# Patient Record
Sex: Female | Born: 1960 | Race: White | Hispanic: No | Marital: Single | Smoking: Current every day smoker
Health system: Southern US, Community
[De-identification: ages and names within clinical notes are randomized; demographics above are authoritative.]

## PROBLEM LIST (undated history)

## (undated) HISTORY — PX: ABDOMINAL HYSTERECTOMY: SHX81

## (undated) HISTORY — PX: APPENDECTOMY: SHX54

---

## 2019-09-20 ENCOUNTER — Other Ambulatory Visit: Payer: Self-pay

## 2019-09-20 ENCOUNTER — Ambulatory Visit
Admission: EM | Admit: 2019-09-20 | Discharge: 2019-09-20 | Disposition: A | Discharge status: Home / Self Care | Payer: No Typology Code available for payment source | Attending: Family Medicine | Admitting: Family Medicine

## 2019-09-20 ENCOUNTER — Ambulatory Visit (INDEPENDENT_AMBULATORY_CARE_PROVIDER_SITE_OTHER): Payer: No Typology Code available for payment source

## 2019-09-20 ENCOUNTER — Encounter: Payer: Self-pay | Admitting: Emergency Medicine

## 2019-09-20 DIAGNOSIS — S52502A Unspecified fracture of the lower end of left radius, initial encounter for closed fracture: Secondary | ICD-10-CM

## 2019-09-20 DIAGNOSIS — S52602A Unspecified fracture of lower end of left ulna, initial encounter for closed fracture: Secondary | ICD-10-CM

## 2019-09-20 MED ORDER — HYDROCODONE-ACETAMINOPHEN 5-325 MG PO TABS: ORAL_TABLET | ORAL | 0 refills | Status: AC

## 2019-09-20 NOTE — Discharge Instructions (Addendum)
Call [facility] on Monday for follow up early next week
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
## 2019-09-22 ENCOUNTER — Other Ambulatory Visit: Payer: Self-pay | Admitting: Surgery

## 2019-09-22 ENCOUNTER — Other Ambulatory Visit: Payer: Self-pay

## 2019-09-22 ENCOUNTER — Other Ambulatory Visit
Admission: RE | Admit: 2019-09-22 | Discharge: 2019-09-22 | Disposition: A | Discharge status: Home / Self Care | Payer: BC Managed Care – PPO | Source: Ambulatory Visit | Attending: Surgery | Admitting: Surgery

## 2019-09-22 DIAGNOSIS — Z20822 Contact with and (suspected) exposure to covid-19: Secondary | ICD-10-CM | POA: Insufficient documentation

## 2019-09-22 DIAGNOSIS — Z01812 Encounter for preprocedural laboratory examination: Secondary | ICD-10-CM | POA: Insufficient documentation

## 2019-09-22 LAB — SARS CORONAVIRUS 2 (TAT 6-24 HRS): SARS Coronavirus 2: NEGATIVE

## 2019-09-25 ENCOUNTER — Encounter: Payer: Self-pay | Admitting: Surgery

## 2019-09-25 ENCOUNTER — Ambulatory Visit: Payer: BC Managed Care – PPO | Admitting: Registered Nurse

## 2019-09-25 ENCOUNTER — Encounter
Admission: RE | Disposition: A | Discharge status: Home / Self Care | Payer: Self-pay | Source: Home / Self Care | Attending: Surgery

## 2019-09-25 ENCOUNTER — Other Ambulatory Visit: Payer: Self-pay

## 2019-09-25 ENCOUNTER — Ambulatory Visit
Admission: RE | Admit: 2019-09-25 | Discharge: 2019-09-25 | Disposition: A | Discharge status: Home / Self Care | Payer: BC Managed Care – PPO | Attending: Surgery | Admitting: Surgery

## 2019-09-25 ENCOUNTER — Ambulatory Visit: Payer: BC Managed Care – PPO

## 2019-09-25 DIAGNOSIS — Z7982 Long term (current) use of aspirin: Secondary | ICD-10-CM | POA: Insufficient documentation

## 2019-09-25 DIAGNOSIS — G5602 Carpal tunnel syndrome, left upper limb: Secondary | ICD-10-CM | POA: Insufficient documentation

## 2019-09-25 DIAGNOSIS — S52612A Displaced fracture of left ulna styloid process, initial encounter for closed fracture: Secondary | ICD-10-CM | POA: Insufficient documentation

## 2019-09-25 DIAGNOSIS — S52552A Other extraarticular fracture of lower end of left radius, initial encounter for closed fracture: Secondary | ICD-10-CM | POA: Insufficient documentation

## 2019-09-25 DIAGNOSIS — W010XXA Fall on same level from slipping, tripping and stumbling without subsequent striking against object, initial encounter: Secondary | ICD-10-CM | POA: Diagnosis not present

## 2019-09-25 DIAGNOSIS — F17200 Nicotine dependence, unspecified, uncomplicated: Secondary | ICD-10-CM | POA: Insufficient documentation

## 2019-09-25 HISTORY — PX: ORIF WRIST FRACTURE: SHX2133

## 2019-09-25 HISTORY — PX: CARPAL TUNNEL RELEASE: SHX101

## 2019-09-25 SURGERY — OPEN REDUCTION INTERNAL FIXATION (ORIF) WRIST FRACTURE
Anesthesia: General | Site: Wrist | Laterality: Left

## 2019-09-25 MED ORDER — METOCLOPRAMIDE HCL 5 MG/ML IJ SOLN: 5.0000 mg | Freq: Three times a day (TID) | INTRAMUSCULAR | Status: DC | PRN

## 2019-09-25 MED ORDER — PROPOFOL 10 MG/ML IV BOLUS
INTRAVENOUS | Status: AC
  Filled 2019-09-25: qty 20

## 2019-09-25 MED ORDER — DEXAMETHASONE SODIUM PHOSPHATE 10 MG/ML IJ SOLN
INTRAMUSCULAR | Status: DC | PRN
  Administered 2019-09-25: 6 mg via INTRAVENOUS

## 2019-09-25 MED ORDER — DEXMEDETOMIDINE HCL 200 MCG/2ML IV SOLN
INTRAVENOUS | Status: DC | PRN
  Administered 2019-09-25: 12 ug via INTRAVENOUS
  Administered 2019-09-25: 8 ug via INTRAVENOUS

## 2019-09-25 MED ORDER — ONDANSETRON HCL 4 MG PO TABS: 4.0000 mg | ORAL_TABLET | Freq: Four times a day (QID) | ORAL | Status: DC | PRN

## 2019-09-25 MED ORDER — ACETAMINOPHEN 10 MG/ML IV SOLN
INTRAVENOUS | Status: DC | PRN
  Administered 2019-09-25: 1000 mg via INTRAVENOUS

## 2019-09-25 MED ORDER — FENTANYL CITRATE (PF) 100 MCG/2ML IJ SOLN
INTRAMUSCULAR | Status: AC
  Filled 2019-09-25: qty 2

## 2019-09-25 MED ORDER — MIDAZOLAM HCL 2 MG/2ML IJ SOLN
INTRAMUSCULAR | Status: DC | PRN
  Administered 2019-09-25: 2 mg via INTRAVENOUS

## 2019-09-25 MED ORDER — CHLORHEXIDINE GLUCONATE 0.12 % MT SOLN
OROMUCOSAL | Status: AC
  Administered 2019-09-25: 15 mL via OROMUCOSAL
  Filled 2019-09-25: qty 15

## 2019-09-25 MED ORDER — DROPERIDOL 2.5 MG/ML IJ SOLN
0.6250 mg | Freq: Once | INTRAMUSCULAR | Status: DC | PRN
  Filled 2019-09-25: qty 2

## 2019-09-25 MED ORDER — FENTANYL CITRATE (PF) 100 MCG/2ML IJ SOLN
INTRAMUSCULAR | Status: AC
  Administered 2019-09-25: 25 ug via INTRAVENOUS
  Filled 2019-09-25: qty 2

## 2019-09-25 MED ORDER — METOCLOPRAMIDE HCL 10 MG PO TABS: 5.0000 mg | ORAL_TABLET | Freq: Three times a day (TID) | ORAL | Status: DC | PRN

## 2019-09-25 MED ORDER — PROMETHAZINE HCL 25 MG/ML IJ SOLN: 6.2500 mg | INTRAMUSCULAR | Status: DC | PRN

## 2019-09-25 MED ORDER — CEFAZOLIN SODIUM-DEXTROSE 2-4 GM/100ML-% IV SOLN
INTRAVENOUS | Status: AC
  Filled 2019-09-25: qty 100

## 2019-09-25 MED ORDER — BUPIVACAINE HCL (PF) 0.5 % IJ SOLN
INTRAMUSCULAR | Status: AC
  Filled 2019-09-25: qty 30

## 2019-09-25 MED ORDER — GLYCOPYRROLATE 0.2 MG/ML IJ SOLN
INTRAMUSCULAR | Status: DC | PRN
  Administered 2019-09-25: .2 mg via INTRAVENOUS

## 2019-09-25 MED ORDER — KETOROLAC TROMETHAMINE 30 MG/ML IJ SOLN: 30.0000 mg | Freq: Once | INTRAMUSCULAR | Status: DC | PRN

## 2019-09-25 MED ORDER — KETOROLAC TROMETHAMINE 30 MG/ML IJ SOLN
INTRAMUSCULAR | Status: AC
  Filled 2019-09-25: qty 1

## 2019-09-25 MED ORDER — METOPROLOL TARTRATE 5 MG/5ML IV SOLN
INTRAVENOUS | Status: DC | PRN
Start: 2019-09-25 — End: 2019-09-25
  Administered 2019-09-25 (×3): 1 mg via INTRAVENOUS
  Administered 2019-09-25: 2 mg via INTRAVENOUS

## 2019-09-25 MED ORDER — ONDANSETRON HCL 4 MG/2ML IJ SOLN: 4.0000 mg | Freq: Four times a day (QID) | INTRAMUSCULAR | Status: DC | PRN

## 2019-09-25 MED ORDER — KETOROLAC TROMETHAMINE 30 MG/ML IJ SOLN
INTRAMUSCULAR | Status: DC | PRN
  Administered 2019-09-25: 15 mg via INTRAVENOUS

## 2019-09-25 MED ORDER — PROPOFOL 10 MG/ML IV BOLUS
INTRAVENOUS | Status: DC | PRN
  Administered 2019-09-25: 120 mg via INTRAVENOUS
  Administered 2019-09-25: 20 mg via INTRAVENOUS

## 2019-09-25 MED ORDER — HYDROCODONE-ACETAMINOPHEN 7.5-325 MG PO TABS
1.0000 | ORAL_TABLET | Freq: Once | ORAL | Status: DC | PRN
  Filled 2019-09-25: qty 1

## 2019-09-25 MED ORDER — EPHEDRINE 5 MG/ML INJ
INTRAVENOUS | Status: AC
  Filled 2019-09-25: qty 10

## 2019-09-25 MED ORDER — LACTATED RINGERS IV SOLN: INTRAVENOUS | Status: DC

## 2019-09-25 MED ORDER — HYDROCODONE-ACETAMINOPHEN 5-325 MG PO TABS: 1.0000 | ORAL_TABLET | ORAL | Status: DC | PRN

## 2019-09-25 MED ORDER — SODIUM CHLORIDE (PF) 0.9 % IJ SOLN
INTRAMUSCULAR | Status: AC
  Filled 2019-09-25: qty 10

## 2019-09-25 MED ORDER — CHLORHEXIDINE GLUCONATE 0.12 % MT SOLN: 15.0000 mL | Freq: Once | OROMUCOSAL | Status: AC

## 2019-09-25 MED ORDER — DEXAMETHASONE SODIUM PHOSPHATE 10 MG/ML IJ SOLN
INTRAMUSCULAR | Status: AC
  Filled 2019-09-25: qty 1

## 2019-09-25 MED ORDER — ACETAMINOPHEN 160 MG/5ML PO SOLN
325.0000 mg | ORAL | Status: DC | PRN
  Filled 2019-09-25: qty 20.3

## 2019-09-25 MED ORDER — MIDAZOLAM HCL 2 MG/2ML IJ SOLN
INTRAMUSCULAR | Status: AC
  Filled 2019-09-25: qty 2

## 2019-09-25 MED ORDER — FENTANYL CITRATE (PF) 100 MCG/2ML IJ SOLN
25.0000 ug | INTRAMUSCULAR | Status: DC | PRN
  Administered 2019-09-25 (×2): 50 ug via INTRAVENOUS

## 2019-09-25 MED ORDER — LIDOCAINE HCL (PF) 2 % IJ SOLN
INTRAMUSCULAR | Status: AC
  Filled 2019-09-25: qty 5

## 2019-09-25 MED ORDER — GLYCOPYRROLATE 0.2 MG/ML IJ SOLN
INTRAMUSCULAR | Status: AC
  Filled 2019-09-25: qty 1

## 2019-09-25 MED ORDER — ONDANSETRON HCL 4 MG/2ML IJ SOLN
INTRAMUSCULAR | Status: DC | PRN
  Administered 2019-09-25: 4 mg via INTRAVENOUS

## 2019-09-25 MED ORDER — ACETAMINOPHEN 325 MG PO TABS: 325.0000 mg | ORAL_TABLET | ORAL | Status: DC | PRN

## 2019-09-25 MED ORDER — FENTANYL CITRATE (PF) 100 MCG/2ML IJ SOLN
INTRAMUSCULAR | Status: DC | PRN
  Administered 2019-09-25 (×4): 25 ug via INTRAVENOUS

## 2019-09-25 MED ORDER — METOPROLOL TARTRATE 5 MG/5ML IV SOLN
INTRAVENOUS | Status: AC
  Filled 2019-09-25: qty 5

## 2019-09-25 MED ORDER — ACETAMINOPHEN 10 MG/ML IV SOLN
INTRAVENOUS | Status: AC
  Filled 2019-09-25: qty 100

## 2019-09-25 MED ORDER — KETAMINE HCL 10 MG/ML IJ SOLN
INTRAMUSCULAR | Status: DC | PRN
Start: 2019-09-25 — End: 2019-09-25
  Administered 2019-09-25: 30 mg via INTRAVENOUS

## 2019-09-25 MED ORDER — BUPIVACAINE HCL (PF) 0.5 % IJ SOLN
INTRAMUSCULAR | Status: DC | PRN
  Administered 2019-09-25: 10 mL

## 2019-09-25 MED ORDER — ORAL CARE MOUTH RINSE: 15.0000 mL | Freq: Once | OROMUCOSAL | Status: AC

## 2019-09-25 MED ORDER — ONDANSETRON HCL 4 MG/2ML IJ SOLN
INTRAMUSCULAR | Status: AC
  Filled 2019-09-25: qty 2

## 2019-09-25 MED ORDER — LIDOCAINE HCL (CARDIAC) PF 100 MG/5ML IV SOSY
PREFILLED_SYRINGE | INTRAVENOUS | Status: DC | PRN
  Administered 2019-09-25: 60 mg via INTRAVENOUS
  Administered 2019-09-25: 40 mg via INTRAVENOUS

## 2019-09-25 MED ORDER — CEFAZOLIN SODIUM-DEXTROSE 2-4 GM/100ML-% IV SOLN
2.0000 g | INTRAVENOUS | Status: AC
  Administered 2019-09-25: 2 g via INTRAVENOUS

## 2019-09-25 MED ORDER — POTASSIUM CHLORIDE IN NACL 20-0.9 MEQ/L-% IV SOLN
INTRAVENOUS | Status: DC
  Filled 2019-09-25: qty 1000

## 2019-09-25 MED ORDER — EPHEDRINE SULFATE 50 MG/ML IJ SOLN
INTRAMUSCULAR | Status: DC | PRN
  Administered 2019-09-25: 10 mg via INTRAVENOUS

## 2019-09-25 SURGICAL SUPPLY — 57 items
APL PRP STRL LF DISP 70% ISPRP (MISCELLANEOUS) ×4
BIT DRILL 2.2 SS TIBIAL (BIT) ×4 IMPLANT
BNDG COHESIVE 4X5 TAN STRL (GAUZE/BANDAGES/DRESSINGS) ×4 IMPLANT
BNDG ELASTIC 4X5.8 VLCR STR LF (GAUZE/BANDAGES/DRESSINGS) ×4 IMPLANT
BNDG ESMARK 4X12 TAN STRL LF (GAUZE/BANDAGES/DRESSINGS) ×4 IMPLANT
CANISTER SUCT 1200ML W/VALVE (MISCELLANEOUS) ×4 IMPLANT
CHLORAPREP W/TINT 26 (MISCELLANEOUS) ×8 IMPLANT
CLOSURE WOUND 1/4X4 (GAUZE/BANDAGES/DRESSINGS) ×1
CORD BIP STRL DISP 12FT (MISCELLANEOUS) ×4 IMPLANT
COVER WAND RF STERILE (DRAPES) ×4 IMPLANT
CUFF TOURN SGL QUICK 18X4 (TOURNIQUET CUFF) ×4 IMPLANT
DRAPE FLUOR MINI C-ARM 54X84 (DRAPES) ×4 IMPLANT
DRAPE SPLIT 6X30 W/TAPE (DRAPES) ×4 IMPLANT
DRAPE SURG 17X11 SM STRL (DRAPES) ×4 IMPLANT
DRAPE U-SHAPE 47X51 STRL (DRAPES) ×4 IMPLANT
ELECT CAUTERY BLADE 6.4 (BLADE) ×4 IMPLANT
ELECT REM PT RETURN 9FT ADLT (ELECTROSURGICAL) ×4
ELECTRODE REM PT RTRN 9FT ADLT (ELECTROSURGICAL) ×2 IMPLANT
FORCEPS JEWEL BIP 4-3/4 STR (INSTRUMENTS) ×4 IMPLANT
GAUZE SPONGE 4X4 12PLY STRL (GAUZE/BANDAGES/DRESSINGS) ×4 IMPLANT
GAUZE XEROFORM 1X8 LF (GAUZE/BANDAGES/DRESSINGS) ×4 IMPLANT
GLOVE BIO SURGEON STRL SZ8 (GLOVE) ×4 IMPLANT
GLOVE INDICATOR 8.0 STRL GRN (GLOVE) ×4 IMPLANT
GOWN STRL REUS W/ TWL LRG LVL3 (GOWN DISPOSABLE) ×2 IMPLANT
GOWN STRL REUS W/ TWL XL LVL3 (GOWN DISPOSABLE) ×2 IMPLANT
GOWN STRL REUS W/TWL LRG LVL3 (GOWN DISPOSABLE) ×4
GOWN STRL REUS W/TWL XL LVL3 (GOWN DISPOSABLE) ×4
K-WIRE 1.6 (WIRE) ×8
K-WIRE FX5X1.6XNS BN SS (WIRE) ×4
KIT TURNOVER KIT A (KITS) ×4 IMPLANT
KWIRE FX5X1.6XNS BN SS (WIRE) ×4 IMPLANT
NEEDLE FILTER BLUNT 18X 1/2SAF (NEEDLE) ×2
NEEDLE FILTER BLUNT 18X1 1/2 (NEEDLE) ×2 IMPLANT
NS IRRIG 500ML POUR BTL (IV SOLUTION) ×4 IMPLANT
PACK EXTREMITY (MISCELLANEOUS) ×4 IMPLANT
PADDING CAST 4IN STRL (MISCELLANEOUS) ×4
PADDING CAST BLEND 4X4 STRL (MISCELLANEOUS) ×4 IMPLANT
PEG LOCKING SMOOTH 2.2X14 (Peg) ×4 IMPLANT
PEG LOCKING SMOOTH 2.2X15 (Peg) ×4 IMPLANT
PEG LOCKING SMOOTH 2.2X16 (Screw) ×12 IMPLANT
PEG LOCKING SMOOTH 2.2X18 (Peg) ×4 IMPLANT
PLATE NARROW DVR LEFT (Plate) ×4 IMPLANT
SCREW 2.7X12MM (Screw) ×8 IMPLANT
SCREW LOCK 12X2.7X 3 LD (Screw) ×2 IMPLANT
SCREW LOCKING 2.7X12MM (Screw) ×4 IMPLANT
SCREW NONLOCK 2.7X20MM (Screw) ×4 IMPLANT
SPLINT CAST 1 STEP 3X12 (MISCELLANEOUS) ×4 IMPLANT
SPLINT CAST 1 STEP 4X15 (MISCELLANEOUS) IMPLANT
STAPLER SKIN PROX 35W (STAPLE) ×4 IMPLANT
STOCKINETTE IMPERVIOUS 9X36 MD (GAUZE/BANDAGES/DRESSINGS) ×4 IMPLANT
STRIP CLOSURE SKIN 1/4X4 (GAUZE/BANDAGES/DRESSINGS) ×3 IMPLANT
SUT PROLENE 4 0 PS 2 18 (SUTURE) ×4 IMPLANT
SUT VIC AB 2-0 SH 27 (SUTURE) ×4
SUT VIC AB 2-0 SH 27XBRD (SUTURE) ×2 IMPLANT
SUT VIC AB 3-0 SH 27 (SUTURE) ×4
SUT VIC AB 3-0 SH 27X BRD (SUTURE) ×2 IMPLANT
SYR 10ML LL (SYRINGE) ×4 IMPLANT

## 2019-09-25 NOTE — Discharge Instructions (Addendum)
AMBULATORY SURGERY  °DISCHARGE INSTRUCTIONS ° ° °1) The drugs that you were given will stay in your system until tomorrow so for the next 24 hours you should not: ° °A) Drive an automobile °B) Make any legal decisions °C) Drink any alcoholic beverage ° ° °2) You may resume regular meals tomorrow.  Today it is better to start with liquids and gradually work up to solid foods. ° °You may eat anything you prefer, but it is better to start with liquids, then soup and crackers, and gradually work up to solid foods. ° ° °3) Please notify your doctor immediately if you have any unusual bleeding, trouble breathing, redness and pain at the surgery site, drainage, fever, or pain not relieved by medication. ° ° ° °4) Additional Instructions: ° ° ° ° ° ° ° °Please contact your physician with any problems or Same Day Surgery at [phone], Monday through Friday 6 am to 4 pm, or [place] [facility] [place] Main number at [phone].Orthopedic discharge instructions: °Keep splint dry and intact. °Keep hand elevated above heart level. °Apply ice to affected area frequently. °Take ibuprofen 600-800 mg TID with meals for 7-10 days, then as necessary. °Take pain medication as prescribed or ES Tylenol when needed.  °Return for follow-up in 10-14 days or as scheduled. °

## 2019-09-25 NOTE — Anesthesia Postprocedure Evaluation (Signed)
Anesthesia Post Note  Patient: Tara Foster  Procedure(s) Performed: OPEN REDUCTION INTERNAL FIXATION (ORIF) WRIST FRACTURE (Left Wrist)  Patient location during evaluation: PACU Anesthesia Type: General Level of consciousness: awake and alert Pain management: pain level controlled Vital Signs Assessment: post-procedure vital signs reviewed and stable Respiratory status: spontaneous breathing, nonlabored ventilation and respiratory function stable Cardiovascular status: blood pressure returned to baseline and stable Postop Assessment: no apparent nausea or vomiting Anesthetic complications: no   No complications documented.   Last Vitals:  Vitals:   09/25/19 1315 09/25/19 1317  BP:    Pulse:    Resp:    Temp:    SpO2: 95% 93%    Last Pain:  Vitals:   09/25/19 1317  TempSrc:   PainSc: 6                  Jamilynn Whitacre Garry Heater

## 2019-09-25 NOTE — Anesthesia Preprocedure Evaluation (Signed)
Anesthesia Evaluation  Patient identified by MRN, date of birth, ID band Patient awake    Reviewed: Allergy & Precautions, H&P , NPO status , Patient's Chart, lab work & pertinent test results, reviewed documented beta blocker date and time   Airway Mallampati: II  TM Distance: >3 FB Neck ROM: full    Dental  (+) Caps, Teeth Intact   Pulmonary neg pulmonary ROS, Current Smoker and Patient abstained from smoking.,    Pulmonary exam normal        Cardiovascular negative cardio ROS Normal cardiovascular exam     Neuro/Psych negative neurological ROS     GI/Hepatic negative GI ROS, Neg liver ROS, neg GERD  ,  Endo/Other  negative endocrine ROS  Renal/GU negative Renal ROS     Musculoskeletal negative musculoskeletal ROS (+)   Abdominal   Peds  Hematology negative hematology ROS (+)   Anesthesia Other Findings History reviewed. No pertinent past medical history. Past Surgical History: No date: ABDOMINAL HYSTERECTOMY No date: APPENDECTOMY BMI    Body Mass Index: 21.94 kg/m     Reproductive/Obstetrics                             Anesthesia Physical Anesthesia Plan  ASA: II  Anesthesia Plan: General   Post-op Pain Management:    Induction: Intravenous  PONV Risk Score and Plan: Ondansetron and Treatment may vary due to age or medical condition  Airway Management Planned: LMA  Additional Equipment:   Intra-op Plan:   Post-operative Plan: Extubation in OR  Informed Consent: I have reviewed the patients History and Physical, chart, labs and discussed the procedure including the risks, benefits and alternatives for the proposed anesthesia with the patient or authorized representative who has indicated his/her understanding and acceptance.     Dental Advisory Given  Plan Discussed with: CRNA  Anesthesia Plan Comments:         Anesthesia Quick Evaluation
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
## 2019-09-25 NOTE — H&P (Signed)
History of Present Illness: Tara Foster is a 59 y.o. female who presents today for evaluation of a left wrist injury sustained on 09/20/2019. The patient had gotten up in the middle night and was on the stairs when she tripped and fell and landed directly on her left wrist. The patient did have pain and swelling initially and went to the urgent care later that day. At the urgent care x-rays of the left wrist demonstrated a comminuted left distal radius fracture with dorsal angulation of the fracture fragment. She was placed in a sugar tong splint and instructed to follow-up with orthopedics. She presents today reporting moderate discomfort in the left wrist, she is taking Norco during the day for discomfort at this time. She denies any repeat trauma or injury affecting the left hand. She denies any open wound at the time of the injury. She denies numbness and tingling to the left hand but does report occasional tingling in the left thumb. She denies any history of numbness or tingling to left upper extremity. She denies any personal history of heart attack, stroke, asthma or COPD. No personal history of blood clots. She does take 81 mg aspirin daily.  Past Medical History: History reviewed. No pertinent past medical history.  Past Surgical History: . APPENDECTOMY  . HYSTERECTOMY  partial   Past Family History: . Other Mother  . Cancer Father   Medications: . aspirin 81 MG EC tablet Take 81 mg by mouth once daily  . b complex vitamins tablet Take 1 tablet by mouth once daily  . HYDROcodone-acetaminophen (NORCO) 5-325 mg tablet Take 1 tablet by mouth every 6 (six) hours as needed 16 tablet 0  . multivitamin/iron/folic acid (CENTRUM ORAL) Take 1 tablet by mouth once daily   No current Epic-ordered facility-administered medications on file.   Allergies: No Known Allergies   Review of Systems:  A comprehensive 14 point ROS was performed, reviewed by me today, and the pertinent orthopaedic  findings are documented in the HPI.  Physical Exam: BP 134/80 (BP Location: Left upper arm, Patient Position: Sitting, BP Cuff Size: Adult)  Ht 157.5 cm (5\' 2" )  Wt 54.4 kg (120 lb)  BMI 21.95 kg/m  General/Constitutional: The patient appears to be well-nourished, well-developed, and in no acute distress. Neuro/Psych: Normal mood and affect, oriented to person, place and time. Eyes: Non-icteric. Pupils are equal, round, and reactive to light, and exhibit synchronous movement. ENT: Unremarkable. Lymphatic: No palpable adenopathy. Respiratory: Lungs clear to auscultation, Normal chest excursion, No wheezes and Non-labored breathing Cardiovascular: Regular rate and rhythm. No murmurs. and No edema, swelling or tenderness, except as noted in detailed exam. Integumentary: No impressive skin lesions present, except as noted in detailed exam. Musculoskeletal: Unremarkable, except as noted in detailed exam.  The patient presents today wearing a shoulder sling with the arm in a sugar tong splint. The patient is able to gently flex and extend all digits without discomfort. Sensation is intact to light touch along the dorsal and volar aspect of all digits, other than mild tingling at the distal tip of the left thumb. The Ace wrap was slightly loosened along the left upper extremity. Cap refill is intact to each individual digit. Sensation is intact to light touch along the more proximal aspect of the sugar tong splint as well.  Imaging: X-rays from the urgent care were reviewed at today's visit. These x-rays are detailed in the HPI above. X-rays also demonstrate a small ulnar styloid fracture as well.  Impression:  Closed Colles' fracture of left radius. Acute carpal tunnel syndrome of left wrist.  Plan:  1. Treatment options were discussed today with the patient. 2. Risk and benefits of a left distal radius ORIF with probable carpal tunnel release were discussed today with the patient. She would  like to proceed with surgery, surgery will be scheduled with Dr. Joice Lofts on 09/25/2019. 3. The patient does report mild tingling to the left thumb, will reevaluate on day of surgery, if continued tingling will perform carpal tunnel release at the time of the open reduction internal fixation. 4. This document will serve as a surgical history and physical for the patient. 5. The patient will follow-up per standard post-op protocol. They can call the clinic they have any questions, new symptoms develop or symptoms worsen.  The procedure was discussed with the patient, as were the potential risks (including bleeding, infection, nerve and/or blood vessel injury, persistent or recurrent pain, failure of the repair/reduction, progression of arthritis, need for further surgery, blood clots, strokes, heart attacks and/or arhythmias, pneumonia, etc.) and benefits. The patient states her understanding and wishes to proceed.   H&P reviewed and patient re-examined. No changes.

## 2019-09-25 NOTE — Anesthesia Procedure Notes (Signed)
Procedure Name: LMA Insertion Date/Time: 09/25/2019 9:56 AM Performed by: Lynden Oxford, CRNA Pre-anesthesia Checklist: Patient identified, Emergency Drugs available, Suction available and Patient being monitored Patient Re-evaluated:Patient Re-evaluated prior to induction Oxygen Delivery Method: Circle system utilized Preoxygenation: Pre-oxygenation with 100% oxygen Induction Type: IV induction Ventilation: Mask ventilation without difficulty LMA: LMA flexible inserted LMA Size: 3.5 Tube type: Oral Number of attempts: 1 Airway Equipment and Method: Patient positioned with wedge pillow Placement Confirmation: positive ETCO2 and breath sounds checked- equal and bilateral Tube secured with: Tape Dental Injury: Teeth and Oropharynx as per pre-operative assessment

## 2019-09-25 NOTE — Transfer of Care (Signed)
Immediate Anesthesia Transfer of Care Note  Patient: Tara Foster  Procedure(s) Performed: OPEN REDUCTION INTERNAL FIXATION (ORIF) WRIST FRACTURE (Left Wrist)  Patient Location: PACU  Anesthesia Type:General  Level of Consciousness: awake, alert  and oriented  Airway & Oxygen Therapy: Patient Spontanous Breathing and Patient connected to face mask oxygen  Post-op Assessment: Report given to RN and Post -op Vital signs reviewed and stable  Post vital signs: Reviewed and stable  Last Vitals:  Vitals Value Taken Time  BP 159/77 09/25/19 1159  Temp    Pulse 71 09/25/19 1204  Resp 33 09/25/19 1204  SpO2 100 % 09/25/19 1204  Vitals shown include unvalidated device data.  Last Pain:  Vitals:   09/25/19 0843  TempSrc: Oral  PainSc: 5          Complications: No complications documented.

## 2019-09-26 ENCOUNTER — Encounter: Payer: Self-pay | Admitting: Surgery

## 2019-10-20 ENCOUNTER — Ambulatory Visit: Payer: BC Managed Care – PPO | Attending: Student | Admitting: Occupational Therapy

## 2019-10-20 ENCOUNTER — Encounter: Payer: Self-pay | Admitting: Occupational Therapy

## 2019-10-20 ENCOUNTER — Other Ambulatory Visit: Payer: Self-pay

## 2019-10-20 DIAGNOSIS — L905 Scar conditions and fibrosis of skin: Secondary | ICD-10-CM | POA: Diagnosis present

## 2019-10-20 DIAGNOSIS — M6281 Muscle weakness (generalized): Secondary | ICD-10-CM

## 2019-10-20 DIAGNOSIS — M25632 Stiffness of left wrist, not elsewhere classified: Secondary | ICD-10-CM

## 2019-10-20 NOTE — Patient Instructions (Addendum)
Contrast - AROM tenndon glides  Opposition to base of 5th  10 reps Wrist AROM in all planes  10 reps  Splint on -and cover incision when washing dishes  Neosporin on

## 2019-10-20 NOTE — Therapy (Signed)
[place] [facility] PHYSICAL AND SPORTS MEDICINE [address], [place], [address] Phone: [phone]   Fax:  [phone]  Occupational Therapy Treatment  Patient Details  Name: Tara Foster MRN: [id number] Date of Birth: 03-Apr-1960 Referring Provider (OT): Marney Doctor   Encounter Date: 10/20/2019   OT End of Session - 10/20/19 0851    Visit Number 1    Number of Visits 12    Date for OT Re-Evaluation 12/15/19    OT Start Time 0745    OT Stop Time 0830    OT Time Calculation (min) 45 min    Activity Tolerance Patient tolerated treatment well    Behavior During Therapy [facility] for tasks assessed/performed           History reviewed. No pertinent past medical history.  Past Surgical History:  Procedure Laterality Date  . ABDOMINAL HYSTERECTOMY    . APPENDECTOMY    . CARPAL TUNNEL RELEASE Left 09/25/2019   Procedure: CARPAL TUNNEL RELEASE;  Surgeon: Christena Flake, MD;  Location: [facility] ORS;  Service: Orthopedics;  Laterality: Left;  . ORIF WRIST FRACTURE Left 09/25/2019   Procedure: OPEN REDUCTION INTERNAL FIXATION (ORIF) WRIST FRACTURE;  Surgeon: Christena Flake, MD;  Location: [facility] ORS;  Service: Orthopedics;  Laterality: Left;    There were no vitals filed for this visit.   Subjective Assessment - 10/20/19 0824    Subjective  I fell at night going to the bathroom - doing the stairs- but I have been doing my exercises like he told me - look and my strips come off last week - I have been cleaning the incision with alcohol    Pertinent History Pt fell on 7/17 , had surgery on 09/25/19 -and stitches come out 10/09/2019 - I had been using my hand cooking , doing my hair , washing dishes and watering my plants    Patient Stated Goals Want the motion and strength back in my L hand and wrist so I can do my yardwork , go back to work - I have to be able to pick up 70lbs , cooking , pulling and carrying objects    Currently in Pain? Yes    Pain Score 2      Pain Location Wrist    Pain Orientation Left    Pain Descriptors / Indicators Tightness   pull   Pain Type Surgical pain    Pain Onset 1 to 4 weeks ago    Pain Frequency Intermittent              [facility] OT Assessment - 10/20/19 0001      Assessment   Medical Diagnosis L wrist raduys fx with ORIF , ulna fx    Referring Provider (OT) Marney Doctor    Onset Date/Surgical Date 09/25/19    Hand Dominance Right    Next MD Visit --   11/07/2019     Precautions   Required Braces or Orthoses --   wrist splint on most all the time when using hand      Balance Screen   Has the patient fallen in the past 6 months Yes    How many times? 1    Has the patient had a decrease in activity level because of a fear of falling?  No    Is the patient reluctant to leave their home because of a fear of falling?  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[employer] , Presenter, broadcasting, her work in Brewing technologist - 70lbs limit      AROM   Right Wrist Extension 80 Degrees    Right Wrist Flexion 92 Degrees    Right Wrist Radial Deviation 20 Degrees    Right Wrist Ulnar Deviation 30 Degrees    Left Wrist Extension 45 Degrees    Left Wrist Flexion 47 Degrees    Left Wrist Radial Deviation 22 Degrees    Left Wrist Ulnar Deviation 18 Degrees      Left Hand AROM   L Thumb Opposition to Index --   Opposition to base of 5th - pull 3/10    L Index  MCP 0-90 70 Degrees    L Index PIP 0-100 95 Degrees    L Long  MCP 0-90 75 Degrees    L Long PIP 0-100 100 Degrees    L Ring  MCP 0-90 75 Degrees    L Ring PIP 0-100 100 Degrees    L Little  MCP 0-90 80 Degrees    L Little PIP 0-100 100 Degrees            HEP review with pt after 2 cycles of heat and ice And handout provided:   Contrast - AROM tenndon glides  Opposition to base of 5th  10 reps Wrist AROM in all planes  10 reps  Splint on -and cover incision when washing dishes  Neosporin on               OT  Education - 10/20/19 0849    Education Details findings of eval and POC - HEP    Person(s) Educated Patient    Methods Explanation;Demonstration;Tactile cues;Verbal cues;Handout    Comprehension Returned demonstration;Verbalized understanding;Verbal cues required            OT Short Term Goals - 10/20/19 0903      OT SHORT TERM GOAL #1   Title Pt to be independent in HEP to increase AROM and strength in R wrist and hand to be able to use R hand 75% of ADL's and IADL's    Baseline no knowledge    Time 3    Period Weeks    Status New    Target Date 11/10/19      OT SHORT TERM GOAL #2   Title Incision to heal for pt to be independent in scar managements to decrease scar adhesions    Baseline volar wrist scar -red and small scabs - pt to use neosporin and put on glove with washing dishes    Time 3    Period Weeks    Status New    Target Date 11/10/19             OT Long Term Goals - 10/20/19 0904      OT LONG TERM GOAL #1   Title Pt L wrist AROM increase to [facility] to be able to pushup up and fasten bra    Baseline 45 and 47 degrees for wrist ext, flexion RD, UD 22 and 18 degrees - 3 1/2wks s/p    Time 6    Period Weeks    Status New    Target Date 12/01/19      OT LONG TERM GOAL #2   Title L grip and prehension strength increase to more than 50% compare to R to pick up and carry more than 6 lbs withtout symptoms    Baseline 3 1/2 wks s/p  Time 8    Period Weeks    Status New    Target Date 12/15/19                 Plan - 10/20/19 0857    Clinical Impression Statement Pt is 3 1/2 wks s/p ORIF for L distal radius fx  with CTR and ulna fx - pt present with incision little red over volar wrist with some scabbing, pt advice to cover it up when washing dishes and to hold off on any scar massage. Pt report using it in light ADL's -reinforce again with pt precautions - pt show decrease AROM for wrist in all planes , and composite fist -pain at the worse 6/10 per pt  using it at home - soreness - pt ed on HEP and keeping pain under 2/10 - pt limited in using her hand in ADL's and IADL's  - pt can benefit from OT services    OT Occupational Profile and History Problem Focused Assessment - Including review of records relating to presenting problem    Occupational performance deficits (Please refer to evaluation for details): ADL's;IADL's;Leisure;Work;Play;Social Participation    Body Structure / Function / Physical Skills ADL;Flexibility;ROM;UE functional use;Decreased knowledge of precautions;Scar mobility;Edema;Strength;Pain;IADL    Rehab Potential Good    Clinical Decision Making Limited treatment options, no task modification necessary    Comorbidities Affecting Occupational Performance: None    Modification or Assistance to Complete Evaluation  No modification of tasks or assist necessary to complete eval    OT Frequency 2x / week   2 wk for 4 wks and then 1 x wk for 4 wks   OT Duration 8 weeks    OT Treatment/Interventions Self-care/ADL training;Therapeutic exercise;Patient/family education;Splinting;Fluidtherapy;Contrast Bath;Manual Therapy;Scar mobilization;Passive range of motion    Plan assess progress with HEP    OT Home Exercise Plan pt instruction to be seen    Consulted and Agree with Plan of Care Patient           Patient will benefit from skilled therapeutic intervention in order to improve the following deficits and impairments:   Body Structure / Function / Physical Skills: ADL, Flexibility, ROM, UE functional use, Decreased knowledge of precautions, Scar mobility, Edema, Strength, Pain, IADL       Visit Diagnosis: Stiffness of left wrist, not elsewhere classified - Plan: Ot plan of care cert/re-cert  Scar condition and fibrosis of skin - Plan: Ot plan of care cert/re-cert  Muscle weakness (generalized) - Plan: Ot plan of care cert/re-cert    Problem List There are no problems to display for this patient.   Oletta Cohn  OTR/L,CLT 10/20/2019, 9:16 AM  [place] [facility] PHYSICAL AND SPORTS MEDICINE [address], [place], [address] Phone: [phone]   Fax:  [phone]  Name: Tara Foster MRN: [id number] Date of Birth: 12-19-1960

## 2019-10-24 ENCOUNTER — Other Ambulatory Visit: Payer: Self-pay

## 2019-10-24 ENCOUNTER — Ambulatory Visit: Payer: BC Managed Care – PPO | Admitting: Occupational Therapy

## 2019-10-24 DIAGNOSIS — M25632 Stiffness of left wrist, not elsewhere classified: Secondary | ICD-10-CM

## 2019-10-24 DIAGNOSIS — L905 Scar conditions and fibrosis of skin: Secondary | ICD-10-CM

## 2019-10-24 DIAGNOSIS — M6281 Muscle weakness (generalized): Secondary | ICD-10-CM

## 2019-10-24 NOTE — Therapy (Signed)
[place] [facility] PHYSICAL [facility] SPORTS MEDICINE [address], [place], [address] Phone: [phone]   Fax:  [phone]  Occupational Therapy Treatment  Patient Details  Name: Tara Foster MRN: [id number] Date of Birth: 1960/08/16 Referring Provider (OT): Marney Doctor   Encounter Date: 10/24/2019   OT End of Session - 10/24/19 1102    Visit Number 2    Number of Visits 12    Date for OT Re-Evaluation 12/15/19    OT Start Time 1045    OT Stop Time 1125    OT Time Calculation (min) 40 min    Activity Tolerance Patient tolerated treatment well    Behavior During Therapy [facility] for tasks assessed/performed           No past medical history on file.  Past Surgical History:  Procedure Laterality Date  . ABDOMINAL HYSTERECTOMY    . APPENDECTOMY    . CARPAL TUNNEL RELEASE Left 09/25/2019   Procedure: CARPAL TUNNEL RELEASE;  Surgeon: Christena Flake, MD;  Location: [facility] ORS;  Service: Orthopedics;  Laterality: Left;  . ORIF WRIST FRACTURE Left 09/25/2019   Procedure: OPEN REDUCTION INTERNAL FIXATION (ORIF) WRIST FRACTURE;  Surgeon: Christena Flake, MD;  Location: [facility] ORS;  Service: Orthopedics;  Laterality: Left;    There were no vitals filed for this visit.   Subjective Assessment - 10/24/19 1044    Subjective  Doing okay with my exercises - more wrist motion and making fist better- scar looks better- thinking splint irritate it - or rubbs - did put some neosporin on    Pertinent History Pt fell on 7/17 , had surgery on 09/25/19 -and stitches come out 10/09/2019 - I had been using my hand cooking , doing my hair , washing dishes and watering my plants    Patient Stated Goals Want the motion and strength back in my L hand and wrist so I can do my yardwork , go back to work - I have to be able to pick up 70lbs , cooking , pulling and carrying objects    Currently in Pain? No/denies              [facility] OT Assessment - 10/24/19 0001      AROM   Right  Wrist Extension 80 Degrees    Right Wrist Flexion 92 Degrees    Right Wrist Radial Deviation 20 Degrees    Right Wrist Ulnar Deviation 30 Degrees    Left Wrist Extension 52 Degrees    Left Wrist Flexion 52 Degrees    Left Wrist Radial Deviation 25 Degrees    Left Wrist Ulnar Deviation 25 Degrees          L wrist AROM assess - made progress since eval  Scar did not look as red -but small open area proximal part of CT scar and volar wrist crease- pt thinks she took sterristrips off to fast  Pt can do scar massage on proximal scar on forearm Applied sterri strip on 1/2 cm small area that appear to be open  And fitted pt with tubigrip D to wear under her splint for cushioning -to decrease irritation            OT Treatments/Exercises (OP) - 10/24/19 0001      LUE Contrast Bath   Time 9 minutes    Comments prior to ROM - increase motion            HEP review with pt again  and encourage to use heat and ice prior to ROM   And handout provided last time:   AROM tenndon glides - reinforce composite fist to base of palm -and MC flexion to 80-90 - pt to cut her nails  And to keep wrist straigth Opposition to base of 5th  10 reps Wrist AROM in all planes - can add light and gentle AAROM for wrist flexion , ext - with open hand and close fist 10 reps Slight pull - less than 1-2/10  Splint can come off about 50% of time - use in light ADL's         OT Education - 10/24/19 1043    Education Details reinforce ROM and ed on scar managements    Person(s) Educated Patient    Methods Explanation;Demonstration;Tactile cues;Verbal cues;Handout    Comprehension Returned demonstration;Verbalized understanding;Verbal cues required            OT Short Term Goals - 10/20/19 0903      OT SHORT TERM GOAL #1   Title Pt to be independent in HEP to increase AROM and strength in R wrist and hand to be able to use R hand 75% of ADL's and IADL's    Baseline no knowledge    Time 3     Period Weeks    Status New    Target Date 11/10/19      OT SHORT TERM GOAL #2   Title Incision to heal for pt to be independent in scar managements to decrease scar adhesions    Baseline volar wrist scar -red and small scabs - pt to use neosporin and put on glove with washing dishes    Time 3    Period Weeks    Status New    Target Date 11/10/19             OT Long Term Goals - 10/20/19 0904      OT LONG TERM GOAL #1   Title Pt L wrist AROM increase to [facility] to be able to pushup up and fasten bra    Baseline 45 and 47 degrees for wrist ext, flexion RD, UD 22 and 18 degrees - 3 1/2wks s/p    Time 6    Period Weeks    Status New    Target Date 12/01/19      OT LONG TERM GOAL #2   Title L grip and prehension strength increase to more than 50% compare to R to pick up and carry more than 6 lbs withtout symptoms    Baseline 3 1/2 wks s/p    Time 8    Period Weeks    Status New    Target Date 12/15/19                 Plan - 10/24/19 1043    Clinical Impression Statement Pt is 4 wks s/p OFI for L distal radius fx with CTR and ulna fx - pt show increase AROM for wrist and digits compare to eval earlier this week - but appear to have still little open area at volar wrist crease and proximal CT scar - pt thinks she took sterristrips off to early - the redness looks better this date compare to last visit - pt cont to work on [employer] and AROM with pain less than 1-2/10 - and brace on off when sitting and using in light ADL's less than 1 lbs - did put sterristrip on open area this date  OT Occupational Profile and History Problem Focused Assessment - Including review of records relating to presenting problem    Occupational performance deficits (Please refer to evaluation for details): ADL's;IADL's;Leisure;Work;Play;Social Participation    Body Structure / Function / Physical Skills ADL;Flexibility;ROM;UE functional use;Decreased knowledge of precautions;Scar  mobility;Edema;Strength;Pain;IADL    Rehab Potential Good    Clinical Decision Making Limited treatment options, no task modification necessary    Comorbidities Affecting Occupational Performance: None    Modification or Assistance to Complete Evaluation  No modification of tasks or assist necessary to complete eval    OT Frequency 2x / week    OT Duration 8 weeks    OT Treatment/Interventions Self-care/ADL training;Therapeutic exercise;Patient/family education;Splinting;Fluidtherapy;Contrast Bath;Manual Therapy;Scar mobilization;Passive range of motion    Plan assess progress with HEP    OT Home Exercise Plan pt instruction to be seen    Consulted and Agree with Plan of Care Patient           Patient will benefit from skilled therapeutic intervention in order to improve the following deficits and impairments:   Body Structure / Function / Physical Skills: ADL, Flexibility, ROM, UE functional use, Decreased knowledge of precautions, Scar mobility, Edema, Strength, Pain, IADL       Visit Diagnosis: Stiffness of left wrist, not elsewhere classified  Scar condition and fibrosis of skin  Muscle weakness (generalized)    Problem List There are no problems to display for this patient.   Oletta Cohn OTR/l,CLT 10/24/2019, 1:24 PM  [place] [facility] PHYSICAL AND SPORTS MEDICINE [address] [place], [address] Phone: [phone]   Fax:  [phone]  Name: Tara Foster MRN: [id number] Date of Birth: 06-04-1960

## 2019-10-30 ENCOUNTER — Other Ambulatory Visit: Payer: Self-pay

## 2019-10-30 ENCOUNTER — Ambulatory Visit: Payer: BC Managed Care – PPO | Admitting: Occupational Therapy

## 2019-10-30 DIAGNOSIS — M25632 Stiffness of left wrist, not elsewhere classified: Secondary | ICD-10-CM | POA: Diagnosis not present

## 2019-10-30 DIAGNOSIS — M6281 Muscle weakness (generalized): Secondary | ICD-10-CM

## 2019-10-30 DIAGNOSIS — L905 Scar conditions and fibrosis of skin: Secondary | ICD-10-CM

## 2019-10-30 NOTE — Therapy (Signed)
[place] [facility] PHYSICAL [facility] SPORTS MEDICINE [address], [place], [address] Phone: [phone]   Fax:  [phone]  Occupational Therapy Treatment  Patient Details  Name: Tara Foster MRN: [id number] Date of Birth: [birth year] Referring Provider (OT): Marney Doctor   Encounter Date: 10/30/2019   OT End of Session - 10/30/19 0922    Visit Number 3    Number of Visits 12    Date for OT Re-Evaluation 12/15/19    OT Start Time 0900    OT Stop Time 0942    OT Time Calculation (min) 42 min    Activity Tolerance Patient tolerated treatment well    Behavior During Therapy [facility] for tasks assessed/performed           No past medical history on file.  Past Surgical History:  Procedure Laterality Date  . ABDOMINAL HYSTERECTOMY    . APPENDECTOMY    . CARPAL TUNNEL RELEASE Left 09/25/2019   Procedure: CARPAL TUNNEL RELEASE;  Surgeon: Christena Flake, MD;  Location: [facility] ORS;  Service: Orthopedics;  Laterality: Left;  . ORIF WRIST FRACTURE Left 09/25/2019   Procedure: OPEN REDUCTION INTERNAL FIXATION (ORIF) WRIST FRACTURE;  Surgeon: Christena Flake, MD;  Location: [facility] ORS;  Service: Orthopedics;  Laterality: Left;    There were no vitals filed for this visit.   Subjective Assessment - 10/30/19 0918    Subjective  Little shooting pain at night time in wrist- did try and change my sheet - and that hurt - this little place is still open - keeping sterri strip on still    Pertinent History Pt fell on 7/17 , had surgery on 09/25/19 -and stitches come out 10/09/2019 - I had been using my hand cooking , doing my hair , washing dishes and watering my plants    Patient Stated Goals Want the motion and strength back in my L hand and wrist so I can do my yardwork , go back to work - I have to be able to pick up 70lbs , cooking , pulling and carrying objects    Currently in Pain? No/denies              [facility] OT Assessment - 10/30/19 0001      AROM   Left Wrist  Extension 55 Degrees    Left Wrist Flexion 50 Degrees    Left Wrist Radial Deviation 25 Degrees    Left Wrist Ulnar Deviation 30 Degrees             L wrist AROM assess - made progress since eval  Scar did not look as red -but small open area proximal part of CT scar and volar wrist crease- pt thinks she took sterristrips off to fast - pin size Pt can do scar massage on proximal scar on forearm Applied sterri strip on 1/2 cm small area that appear to be open  And fitted pt with tubigrip D to wear under her splint for cushioning -to decrease irritation   Pt report she think she had shingles when she had surgery  And still do           OT Treatments/Exercises (OP) - 10/30/19 0001      LUE Contrast Bath   Time 9 minutes    Comments prior to AROM in all planes            HEP review with pt again   and encourage to use heat and ice prior to ROM  And handout provided last time:   AROM tenndon glides - reinforce composite fist to base of palm -and MC flexion to 80-90 - pt to cut her nails  And to keep wrist straigth Opposition to base of 5th 10 reps Wrist AROM in all planes - can add light and gentle AAROM for wrist flexion , ext - with open hand and close fist 10 reps Slight pull - less than 1-2/10  Splint can come off about 50% of time - use in light ADL's         OT Education - 10/30/19 1223    Education Details reinforce ROM and ed on scar managements    Person(s) Educated Patient    Methods Explanation;Demonstration;Tactile cues;Verbal cues;Handout    Comprehension Returned demonstration;Verbalized understanding;Verbal cues required            OT Short Term Goals - 10/20/19 0903      OT SHORT TERM GOAL #1   Title Pt to be independent in HEP to increase AROM and strength in R wrist and hand to be able to use R hand 75% of ADL's and IADL's    Baseline no knowledge    Time 3    Period Weeks    Status New    Target Date 11/10/19      OT SHORT  TERM GOAL #2   Title Incision to heal for pt to be independent in scar managements to decrease scar adhesions    Baseline volar wrist scar -red and small scabs - pt to use neosporin and put on glove with washing dishes    Time 3    Period Weeks    Status New    Target Date 11/10/19             OT Long Term Goals - 10/20/19 0904      OT LONG TERM GOAL #1   Title Pt L wrist AROM increase to [facility] to be able to pushup up and fasten bra    Baseline 45 and 47 degrees for wrist ext, flexion RD, UD 22 and 18 degrees - 3 1/2wks s/p    Time 6    Period Weeks    Status New    Target Date 12/01/19      OT LONG TERM GOAL #2   Title L grip and prehension strength increase to more than 50% compare to R to pick up and carry more than 6 lbs withtout symptoms    Baseline 3 1/2 wks s/p    Time 8    Period Weeks    Status New    Target Date 12/15/19                 Plan - 10/30/19 1223    Clinical Impression Statement Pt is 5 wks s/p ORIF for L distal radius fx with CTR and ulnar fx - pt show delay healing for volar scar - one area small pin size open and keeping sterristrip on - can do scar massage on other area - pt report she has shingles - and could be why she is slow to heal -  pt cont to show increase AROM in wrist - kept at same HEP    OT Occupational Profile and History Problem Focused Assessment - Including review of records relating to presenting problem    Occupational performance deficits (Please refer to evaluation for details): ADL's;IADL's;Leisure;Work;Play;Social Participation    Body Structure / Function / Physical Skills ADL;Flexibility;ROM;UE functional use;Decreased knowledge of  precautions;Scar mobility;Edema;Strength;Pain;IADL    Rehab Potential Good    Clinical Decision Making Limited treatment options, no task modification necessary    Comorbidities Affecting Occupational Performance: None    Modification or Assistance to Complete Evaluation  No modification of  tasks or assist necessary to complete eval    OT Frequency 2x / week    OT Duration 6 weeks    OT Treatment/Interventions Self-care/ADL training;Therapeutic exercise;Patient/family education;Splinting;Fluidtherapy;Contrast Bath;Manual Therapy;Scar mobilization;Passive range of motion    Plan assess progress with HEP    OT Home Exercise Plan pt instruction to be seen    Consulted and Agree with Plan of Care Patient           Patient will benefit from skilled therapeutic intervention in order to improve the following deficits and impairments:   Body Structure / Function / Physical Skills: ADL, Flexibility, ROM, UE functional use, Decreased knowledge of precautions, Scar mobility, Edema, Strength, Pain, IADL       Visit Diagnosis: Stiffness of left wrist, not elsewhere classified  Scar condition and fibrosis of skin  Muscle weakness (generalized)    Problem List There are no problems to display for this patient.   Oletta Cohn OTR/l,CLT 10/30/2019, 12:26 PM  [place] [facility] PHYSICAL AND SPORTS MEDICINE [address] [place], [address] Phone: [phone]   Fax:  [phone]  Name: Tara Foster MRN: [id number] Date of Birth: 1960/04/19

## 2019-11-04 ENCOUNTER — Other Ambulatory Visit: Payer: Self-pay

## 2019-11-04 ENCOUNTER — Ambulatory Visit: Payer: BC Managed Care – PPO | Admitting: Occupational Therapy

## 2019-11-04 DIAGNOSIS — L905 Scar conditions and fibrosis of skin: Secondary | ICD-10-CM

## 2019-11-04 DIAGNOSIS — M6281 Muscle weakness (generalized): Secondary | ICD-10-CM

## 2019-11-04 DIAGNOSIS — M25632 Stiffness of left wrist, not elsewhere classified: Secondary | ICD-10-CM | POA: Diagnosis not present

## 2019-11-04 NOTE — Therapy (Signed)
[place] [facility]ICINE [address] [place], [address] Phone: [phone]   Fax:  [phone]  Occupational Therapy Treatment  Patient Details  Name: Tara Foster MRN: [id number] Date of Birth: [birth year] Referring Provider (OT): Marney Doctor   Encounter Date: 11/04/2019   OT End of Session - 11/04/19 0828    Visit Number 4    Number of Visits 12    Date for OT Re-Evaluation 12/15/19    OT Start Time 0739    OT Stop Time 0820    OT Time Calculation (min) 41 min    Activity Tolerance Patient tolerated treatment well    Behavior During Therapy [facility] for tasks assessed/performed           No past medical history on file.  Past Surgical History:  Procedure Laterality Date  . ABDOMINAL HYSTERECTOMY    . APPENDECTOMY    . CARPAL TUNNEL RELEASE Left 09/25/2019   Procedure: CARPAL TUNNEL RELEASE;  Surgeon: Christena Flake, MD;  Location: [facility] ORS;  Service: Orthopedics;  Laterality: Left;  . ORIF WRIST FRACTURE Left 09/25/2019   Procedure: OPEN REDUCTION INTERNAL FIXATION (ORIF) WRIST FRACTURE;  Surgeon: Christena Flake, MD;  Location: [facility] ORS;  Service: Orthopedics;  Laterality: Left;    There were no vitals filed for this visit.   Subjective Assessment - 11/04/19 0749    Subjective  Doing better- but I think I over done my bending down exercise - what do you think about this open place - think it looks better    Pertinent History Pt fell on 7/17 , had surgery on 09/25/19 -and stitches come out 10/09/2019 - I had been using my hand cooking , doing my hair , washing dishes and watering my plants    Patient Stated Goals Want the motion and strength back in my L hand and wrist so I can do my yardwork , go back to work - I have to be able to pick up 70lbs , cooking , pulling and carrying objects    Currently in Pain? No/denies             AROM about the same for flexion , extention  Flexion 48, Extention 54  RD, UD,  sup/pro WNL             OT Treatments/Exercises (OP) - 11/04/19 0001      LUE Contrast Bath   Time 9 minutes    Comments prior to ROM            soft tissue mobs done to dorsal and volar forearm - graston tool nr 2 for sweeping - and gentle scar massage on distal CT and volar wrist - hold off on volar wrist crease -appear to be close this date -and pt to let it airdry during day - neosporin and cover at night time   and encourage to use heat and ice prior to ROM    AROM tenndon glides- reinforce composite fist to base of palm -and MC flexion to 80-90 - pt to cut her nails  And to keep wrist straigth Opposition to base of 5th 10 reps Add and review this date AAROM over edge of table for flexion , ext, RD, UD - done well - made great progress in session  And can do extra during day - AAROM for wrist extention prayer stretch - and wrist flexion over armrest  Wrist AROM in all planes to follow after stretches or AAROM  10 reps Slight pull - less than 1-2/10 Splintcan come off during day with light activities - on for heavy act and going out         OT Education - 11/04/19 0828    Education Details progress and changes to HEP    Person(s) Educated Patient    Methods Explanation;Demonstration;Tactile cues;Verbal cues;Handout    Comprehension Returned demonstration;Verbalized understanding;Verbal cues required            OT Short Term Goals - 10/20/19 0903      OT SHORT TERM GOAL #1   Title Pt to be independent in HEP to increase AROM and strength in R wrist and hand to be able to use R hand 75% of ADL's and IADL's    Baseline no knowledge    Time 3    Period Weeks    Status New    Target Date 11/10/19      OT SHORT TERM GOAL #2   Title Incision to heal for pt to be independent in scar managements to decrease scar adhesions    Baseline volar wrist scar -red and small scabs - pt to use neosporin and put on glove with washing dishes    Time 3    Period  Weeks    Status New    Target Date 11/10/19             OT Long Term Goals - 10/20/19 0904      OT LONG TERM GOAL #1   Title Pt L wrist AROM increase to [facility] to be able to pushup up and fasten bra    Baseline 45 and 47 degrees for wrist ext, flexion RD, UD 22 and 18 degrees - 3 1/2wks s/p    Time 6    Period Weeks    Status New    Target Date 12/01/19      OT LONG TERM GOAL #2   Title L grip and prehension strength increase to more than 50% compare to R to pick up and carry more than 6 lbs withtout symptoms    Baseline 3 1/2 wks s/p    Time 8    Period Weeks    Status New    Target Date 12/15/19                 Plan - 11/04/19 0829    Clinical Impression Statement Pt is 5 1/2 wks s/p ORIF L distal radius fx with CTR- pt delayed healing at scar at volar wrist crease and CT - progressing - pt to let it air the next few wks and cover at night time - add this date AAROM and gentle PROM to wrist flexion , extention - made great progress in session    OT Occupational Profile and History Problem Focused Assessment - Including review of records relating to presenting problem    Occupational performance deficits (Please refer to evaluation for details): ADL's;IADL's;Leisure;Work;Play;Social Participation    Body Structure / Function / Physical Skills ADL;Flexibility;ROM;UE functional use;Decreased knowledge of precautions;Scar mobility;Edema;Strength;Pain;IADL    Rehab Potential Good    Clinical Decision Making Limited treatment options, no task modification necessary    Comorbidities Affecting Occupational Performance: None    Modification or Assistance to Complete Evaluation  No modification of tasks or assist necessary to complete eval    OT Frequency 2x / week    OT Duration 6 weeks    OT Treatment/Interventions Self-care/ADL training;Therapeutic exercise;Patient/family education;Splinting;Fluidtherapy;Contrast Bath;Manual Therapy;Scar mobilization;Passive range of motion  Plan assess progress with HEP    OT Home Exercise Plan pt instruction to be seen    Consulted and Agree with Plan of Care Patient           Patient will benefit from skilled therapeutic intervention in order to improve the following deficits and impairments:   Body Structure / Function / Physical Skills: ADL, Flexibility, ROM, UE functional use, Decreased knowledge of precautions, Scar mobility, Edema, Strength, Pain, IADL       Visit Diagnosis: Stiffness of left wrist, not elsewhere classified  Scar condition and fibrosis of skin  Muscle weakness (generalized)    Problem List There are no problems to display for this patient.   Oletta Cohn OTR/L,CLT 11/04/2019, 8:32 AM  [facility] [facility] PHYSICAL AND SPORTS MEDICINE [address], [place], [address] Phone: [phone]   Fax:  [phone]  Name: Tara Foster MRN: [id number] Date of Birth: [birth year]

## 2019-11-07 ENCOUNTER — Ambulatory Visit: Payer: BC Managed Care – PPO | Admitting: Occupational Therapy

## 2019-11-12 ENCOUNTER — Other Ambulatory Visit: Payer: Self-pay

## 2019-11-12 ENCOUNTER — Ambulatory Visit: Payer: BC Managed Care – PPO | Attending: Student | Admitting: Occupational Therapy

## 2019-11-12 DIAGNOSIS — M25632 Stiffness of left wrist, not elsewhere classified: Secondary | ICD-10-CM

## 2019-11-12 DIAGNOSIS — L905 Scar conditions and fibrosis of skin: Secondary | ICD-10-CM

## 2019-11-12 DIAGNOSIS — M6281 Muscle weakness (generalized): Secondary | ICD-10-CM | POA: Diagnosis present

## 2019-11-12 NOTE — Therapy (Signed)
[place] [facility]TS [facility]ICINE [address], [place], [address] Phone: [phone]   Fax:  [phone]  Occupational Therapy Treatment  Patient Details  Name: Tara Foster MRN: [id number] Date of Birth: 03-25-1960 Referring Provider (OT): Marney Doctor   Encounter Date: 11/12/2019   OT End of Session - 11/12/19 1458    Visit Number 5    Number of Visits 12    Date for OT Re-Evaluation 12/15/19    OT Start Time 1400    OT Stop Time 1448    OT Time Calculation (min) 48 min    Activity Tolerance Patient tolerated treatment well    Behavior During Therapy [facility] for tasks assessed/performed           No past medical history on file.  Past Surgical History:  Procedure Laterality Date  . ABDOMINAL HYSTERECTOMY    . APPENDECTOMY    . CARPAL TUNNEL RELEASE Left 09/25/2019   Procedure: CARPAL TUNNEL RELEASE;  Surgeon: Christena Flake, MD;  Location: [facility] ORS;  Service: Orthopedics;  Laterality: Left;  . ORIF WRIST FRACTURE Left 09/25/2019   Procedure: OPEN REDUCTION INTERNAL FIXATION (ORIF) WRIST FRACTURE;  Surgeon: Christena Flake, MD;  Location: [facility] ORS;  Service: Orthopedics;  Laterality: Left;    There were no vitals filed for this visit.   Subjective Assessment - 11/12/19 1456    Subjective  I seen the Dr last Friday - was happy with progress and xray showed some good healing and scar better - I was busy this weekend and did not do as much my exercises and wore my splint more around people    Pertinent History Pt fell on 7/17 , had surgery on 09/25/19 -and stitches come out 10/09/2019 - I had been using my hand cooking , doing my hair , washing dishes and watering my plants    Patient Stated Goals Want the motion and strength back in my L hand and wrist so I can do my yardwork , go back to work - I have to be able to pick up 70lbs , cooking , pulling and carrying objects    Currently in Pain? No/denies              [facility] OT  Assessment - 11/12/19 0001      AROM   Right Wrist Extension 80 Degrees    Right Wrist Flexion 92 Degrees    Right Wrist Radial Deviation 20 Degrees    Right Wrist Ulnar Deviation 30 Degrees    Left Wrist Extension 60 Degrees    Left Wrist Flexion 55 Degrees    Left Wrist Radial Deviation 25 Degrees    Left Wrist Ulnar Deviation 28 Degrees      Strength   Right Hand Grip (lbs) 50    Right Hand Lateral Pinch 13 lbs    Right Hand 3 Point Pinch 11 lbs    Left Hand Grip (lbs) 19    Left Hand Lateral Pinch 7 lbs    Left Hand 3 Point Pinch 6 lbs           Assess progress in AROM -see flow sheet  assess grip and prehension strength See flowsheet         OT Treatments/Exercises (OP) - 11/12/19 0001      LUE Fluidotherapy   Number Minutes Fluidotherapy 8 Minutes    LUE Fluidotherapy Location Hand;Wrist    Comments AROM for wrist flexi, ext, RD, UD  soft tissue mobs done to dorsal and volar forearm - scar massage done this date - manual by OT and mini massager - incision closed and healed - tolerated scar massage well   adhere over volar wrist and crease and CTS  Cont with cica scar pad night time - or under splint   and encourage to use heat prior to ROM    AROM tendon glides- reinforce composite fist to base of palm -and MC flexion  And to keep wrist straigth Opposition to base of 5th 10 reps Done and review AAROM over edge of table for flexion , ext, RD, UD - done well - made great progress in session - can do double time for flexion And can do extra during day - AAROM for wrist extention prayer stretch( needed min A) - and wrist flexion over armrest   add this date and review 1 lbs for wrist in all planes -12 reps pain free And light blue putty for grip , lat and 3 point grip  Pain free - 12 reps to 15 reps  2 x day  Can increase weight and putty to 2nd set in 3 days if no pain       OT Education - 11/12/19 1457    Education Details  progress and changes to HEP    Person(s) Educated Patient    Methods Explanation;Demonstration;Tactile cues;Verbal cues;Handout    Comprehension Returned demonstration;Verbalized understanding;Verbal cues required            OT Short Term Goals - 10/20/19 0903      OT SHORT TERM GOAL #1   Title Pt to be independent in HEP to increase AROM and strength in R wrist and hand to be able to use R hand 75% of ADL's and IADL's    Baseline no knowledge    Time 3    Period Weeks    Status New    Target Date 11/10/19      OT SHORT TERM GOAL #2   Title Incision to heal for pt to be independent in scar managements to decrease scar adhesions    Baseline volar wrist scar -red and small scabs - pt to use neosporin and put on glove with washing dishes    Time 3    Period Weeks    Status New    Target Date 11/10/19             OT Long Term Goals - 10/20/19 0904      OT LONG TERM GOAL #1   Title Pt L wrist AROM increase to [facility] to be able to pushup up and fasten bra    Baseline 45 and 47 degrees for wrist ext, flexion RD, UD 22 and 18 degrees - 3 1/2wks s/p    Time 6    Period Weeks    Status New    Target Date 12/01/19      OT LONG TERM GOAL #2   Title L grip and prehension strength increase to more than 50% compare to R to pick up and carry more than 6 lbs withtout symptoms    Baseline 3 1/2 wks s/p    Time 8    Period Weeks    Status New    Target Date 12/15/19                 Plan - 11/12/19 1458    Clinical Impression Statement Pt is about 6 1/2 wks s/p ORIF L distal radius fx  with CTR - pt incision healed - scar adhere at volar wrist crease and CTS - that took longer to close - cont to show increase AROM -and initiated tihs date strengthening for wrist and grip/prehension - tolerated well    OT Occupational Profile and History Problem Focused Assessment - Including review of records relating to presenting problem    Occupational performance deficits (Please refer to  evaluation for details): ADL's;IADL's;Leisure;Work;Play;Social Participation    Body Structure / Function / Physical Skills ADL;Flexibility;ROM;UE functional use;Decreased knowledge of precautions;Scar mobility;Edema;Strength;Pain;IADL    Rehab Potential Good    Clinical Decision Making Limited treatment options, no task modification necessary    Comorbidities Affecting Occupational Performance: None    Modification or Assistance to Complete Evaluation  No modification of tasks or assist necessary to complete eval    OT Frequency 2x / week    OT Duration 6 weeks    OT Treatment/Interventions Self-care/ADL training;Therapeutic exercise;Patient/family education;Splinting;Fluidtherapy;Contrast Bath;Manual Therapy;Scar mobilization;Passive range of motion    Plan assess progress with HEP    OT Home Exercise Plan pt instruction to be seen    Consulted and Agree with Plan of Care Patient           Patient will benefit from skilled therapeutic intervention in order to improve the following deficits and impairments:   Body Structure / Function / Physical Skills: ADL, Flexibility, ROM, UE functional use, Decreased knowledge of precautions, Scar mobility, Edema, Strength, Pain, IADL       Visit Diagnosis: Stiffness of left wrist, not elsewhere classified  Scar condition and fibrosis of skin  Muscle weakness (generalized)    Problem List There are no problems to display for this patient.   Oletta Cohn OTR/L,CLT 11/12/2019, 3:01 PM   [facility] PHYSICAL AND SPORTS MEDICINE [address] [place], [address] Phone: [phone]   Fax:  [phone]  Name: Tara Foster MRN: [id number] Date of Birth: 05-29-1960

## 2019-11-17 ENCOUNTER — Other Ambulatory Visit: Payer: Self-pay

## 2019-11-17 ENCOUNTER — Ambulatory Visit: Payer: BC Managed Care – PPO | Admitting: Occupational Therapy

## 2019-11-17 DIAGNOSIS — M25632 Stiffness of left wrist, not elsewhere classified: Secondary | ICD-10-CM | POA: Diagnosis not present

## 2019-11-17 DIAGNOSIS — L905 Scar conditions and fibrosis of skin: Secondary | ICD-10-CM

## 2019-11-17 DIAGNOSIS — M6281 Muscle weakness (generalized): Secondary | ICD-10-CM

## 2019-11-17 NOTE — Therapy (Signed)
[facility] [address], [place], [address] Phone: [phone]   Fax:  [phone]  Occupational Therapy Treatment  Patient Details  Name: Tara Foster MRN: [id number] Date of Birth: 11/05/1960 Referring Provider (OT): Marney Doctor   Encounter Date: 11/17/2019   OT End of Session - 11/17/19 1110    Visit Number 6    Number of Visits 12    Date for OT Re-Evaluation 12/15/19    OT Start Time 0821    OT Stop Time 0900    OT Time Calculation (min) 39 min    Activity Tolerance Patient tolerated treatment well    Behavior During Therapy [facility] for tasks assessed/performed           No past medical history on file.  Past Surgical History:  Procedure Laterality Date  . ABDOMINAL HYSTERECTOMY    . APPENDECTOMY    . CARPAL TUNNEL RELEASE Left 09/25/2019   Procedure: CARPAL TUNNEL RELEASE;  Surgeon: Christena Flake, MD;  Location: [facility] ORS;  Service: Orthopedics;  Laterality: Left;  . ORIF WRIST FRACTURE Left 09/25/2019   Procedure: OPEN REDUCTION INTERNAL FIXATION (ORIF) WRIST FRACTURE;  Surgeon: Christena Flake, MD;  Location: [facility] ORS;  Service: Orthopedics;  Laterality: Left;    There were no vitals filed for this visit.   Subjective Assessment - 11/17/19 1109    Subjective  Done okay - using my hand more - like the weight and putty - was little sore next day - but knew over done my putty exercises    Pertinent History Pt fell on 7/17 , had surgery on 09/25/19 -and stitches come out 10/09/2019 - I had been using my hand cooking , doing my hair , washing dishes and watering my plants    Patient Stated Goals Want the motion and strength back in my L hand and wrist so I can do my yardwork , go back to work - I have to be able to pick up 70lbs , cooking , pulling and carrying objects    Currently in Pain? No/denies           Pt cont to show decrease wrist flexion , extention  Pt to focus on her PROM and AAROM               OT Treatments/Exercises (OP) - 11/17/19 0001      LUE Fluidotherapy   Number Minutes Fluidotherapy 8 Minutes    LUE Fluidotherapy Location Hand;Wrist    Comments AROM to increase wrist flexion , extention            soft tissue mobs done to dorsal and volar forearm - scar massage done this date - manual by OT and mini massager - incision closed and healed - tolerated scar massage well   adhere over volar wrist and crease and CTS  Applied this date kinesiotape 30% parallel and 100% pull across  Cont with cica scar pad night time  After 2 days- or under splint  Ed on precautions for taping   and encourage to use heat prior to ROM    AROM tendon glides- reinforce composite fist to base of palm -and MC flexion  And to keep wrist straigth Opposition to base of 5th 10 reps Done and review again AAROM over edge of table for flexion , ext, RD, UD - done well - made great progress in session - can do double time for flexion And can do extra during  day - AAROM for wrist extention prayer stretch( needed min A) - and wrist flexion over armrest  OT done flexion , extention stretches this date 3  X 1 min  Prior to review of 1 lbs for wrist in all planes -12 reps pain free And light blue putty for grip , lat and 3 point grip ( need mod A for lat grip )  Pain free - 12 reps to 15 reps  2 x day  Increase to 3 sets        OT Education - 11/17/19 1110    Education Details progress and changes to HEP    Person(s) Educated Patient    Methods Explanation;Demonstration;Tactile cues;Verbal cues;Handout    Comprehension Returned demonstration;Verbalized understanding;Verbal cues required            OT Short Term Goals - 10/20/19 0903      OT SHORT TERM GOAL #1   Title Pt to be independent in HEP to increase AROM and strength in R wrist and hand to be able to use R hand 75% of ADL's and IADL's    Baseline no knowledge    Time 3    Period Weeks    Status New     Target Date 11/10/19      OT SHORT TERM GOAL #2   Title Incision to heal for pt to be independent in scar managements to decrease scar adhesions    Baseline volar wrist scar -red and small scabs - pt to use neosporin and put on glove with washing dishes    Time 3    Period Weeks    Status New    Target Date 11/10/19             OT Long Term Goals - 10/20/19 0904      OT LONG TERM GOAL #1   Title Pt L wrist AROM increase to [facility] to be able to pushup up and fasten bra    Baseline 45 and 47 degrees for wrist ext, flexion RD, UD 22 and 18 degrees - 3 1/2wks s/p    Time 6    Period Weeks    Status New    Target Date 12/01/19      OT LONG TERM GOAL #2   Title L grip and prehension strength increase to more than 50% compare to R to pick up and carry more than 6 lbs withtout symptoms    Baseline 3 1/2 wks s/p    Time 8    Period Weeks    Status New    Target Date 12/15/19                 Plan - 11/17/19 1110    Clinical Impression Statement Pt is just past 7 wks s/p OFIF L distal radius fx with CTR - pt incision healing - scar still adhere at volar wrist - did kinesiotaping this date to keep on for 48 hrs - and focus cont wrist flexion , ext stretch - with increase sets for 1 lbs weight and putty    OT Occupational Profile and History Problem Focused Assessment - Including review of records relating to presenting problem    Occupational performance deficits (Please refer to evaluation for details): ADL's;IADL's;Leisure;Work;Play;Social Participation    Body Structure / Function / Physical Skills ADL;Flexibility;ROM;UE functional use;Decreased knowledge of precautions;Scar mobility;Edema;Strength;Pain;IADL    Rehab Potential Good    Clinical Decision Making Limited treatment options, no task modification necessary    Comorbidities  Affecting Occupational Performance: None    Modification or Assistance to Complete Evaluation  No modification of tasks or assist necessary to  complete eval    OT Frequency 2x / week    OT Duration 6 weeks    OT Treatment/Interventions Self-care/ADL training;Therapeutic exercise;Patient/family education;Splinting;Fluidtherapy;Contrast Bath;Manual Therapy;Scar mobilization;Passive range of motion    Plan assess progress with HEP    OT Home Exercise Plan pt instruction to be seen    Consulted and Agree with Plan of Care Patient           Patient will benefit from skilled therapeutic intervention in order to improve the following deficits and impairments:   Body Structure / Function / Physical Skills: ADL, Flexibility, ROM, UE functional use, Decreased knowledge of precautions, Scar mobility, Edema, Strength, Pain, IADL       Visit Diagnosis: Stiffness of left wrist, not elsewhere classified  Scar condition and fibrosis of skin  Muscle weakness (generalized)    Problem List There are no problems to display for this patient.   Oletta Cohn OTR/l,CLT 11/17/2019, 11:13 AM  [facility] PHYSICAL AND SPORTS MEDICINE [address], [place], [address] Phone: [phone]   Fax:  [phone]  Name: Tara Foster MRN: [id number] Date of Birth: 01-08-1961

## 2019-11-21 ENCOUNTER — Ambulatory Visit: Payer: BC Managed Care – PPO | Admitting: Occupational Therapy

## 2019-11-21 ENCOUNTER — Other Ambulatory Visit: Payer: Self-pay

## 2019-11-21 DIAGNOSIS — L905 Scar conditions and fibrosis of skin: Secondary | ICD-10-CM

## 2019-11-21 DIAGNOSIS — M25632 Stiffness of left wrist, not elsewhere classified: Secondary | ICD-10-CM

## 2019-11-21 DIAGNOSIS — M6281 Muscle weakness (generalized): Secondary | ICD-10-CM

## 2019-11-21 NOTE — Therapy (Signed)
[place] [facility] PH[facility] MEDICINE [address] [place], [address] Phone: [phone]   Fax:  [phone]  Occupational Therapy Treatment  Patient Details  Name: Tara Foster MRN: [id number] Date of Birth: 04-04-1960 Referring Provider (OT): Marney Doctor   Encounter Date: 11/21/2019   OT End of Session - 11/21/19 0850    Visit Number 7    Number of Visits 12    Date for OT Re-Evaluation 12/15/19    OT Start Time 0834    OT Stop Time 0914    OT Time Calculation (min) 40 min    Activity Tolerance Patient tolerated treatment well    Behavior During Therapy [facility] for tasks assessed/performed           No past medical history on file.  Past Surgical History:  Procedure Laterality Date   ABDOMINAL HYSTERECTOMY     APPENDECTOMY     CARPAL TUNNEL RELEASE Left 09/25/2019   Procedure: CARPAL TUNNEL RELEASE;  Surgeon: Christena Flake, MD;  Location: [facility] ORS;  Service: Orthopedics;  Laterality: Left;   ORIF WRIST FRACTURE Left 09/25/2019   Procedure: OPEN REDUCTION INTERNAL FIXATION (ORIF) WRIST FRACTURE;  Surgeon: Christena Flake, MD;  Location: [facility] ORS;  Service: Orthopedics;  Laterality: Left;    There were no vitals filed for this visit.   Subjective Assessment - 11/21/19 0848    Subjective  Doing great with exercises - using my hand more - can pull my refrigarator door  now- don't wear it at night time anymorea    Pertinent History Pt fell on 7/17 , had surgery on 09/25/19 -and stitches come out 10/09/2019 - I had been using my hand cooking , doing my hair , washing dishes and watering my plants    Patient Stated Goals Want the motion and strength back in my L hand and wrist so I can do my yardwork , go back to work - I have to be able to pick up 70lbs , cooking , pulling and carrying objects    Currently in Pain? No/denies              [facility] OT Assessment - 11/21/19 0001      AROM   Right Wrist Extension 80 Degrees    Right  Wrist Flexion 92 Degrees    Left Wrist Extension 68 Degrees    Left Wrist Flexion 65 Degrees      Strength   Right Hand Grip (lbs) 50    Right Hand Lateral Pinch 13 lbs    Right Hand 3 Point Pinch 11 lbs    Left Hand Grip (lbs) 27    Left Hand Lateral Pinch 9 lbs    Left Hand 3 Point Pinch 8.5 lbs           Measurements taken - great progress   able to upgrade HEP to 2 lbs weight and med firm putty          OT Treatments/Exercises (OP) - 11/21/19 0001      LUE Fluidotherapy   Number Minutes Fluidotherapy 8 Minutes    LUE Fluidotherapy Location Hand;Wrist    Comments AROM for wrist flexion , ext, fising and ext           soft tissue mobs done to dorsal and volar forearm -scar massage done this date - manual by OT and mini massager - tolerated scar massage well  adhere over volar wrist crease and CT - where pt had delay  healing  Cont with cica scar pad night time  After 2 days- or under splint    and encourage to use heatprior to ROM   AROM tendon glides - individual tendon glides provided for pt to do at home  Opposition to base of 5th 10 reps Done AAROM over edge of table for flexion , ext, RD, UD - done well - AAROM for wrist extention prayer stretch( needed min A)- and wrist flexion over armrest - OT done 2 x 1 min  Upgrade to 2  lbs for wrist in all planes -12 reps pain free -  And upgrade to med teal putty for grip , lat and 3 point grip Pain free - 12 reps 1 set of 12 of each 2 x day            OT Education - 11/21/19 0849    Education Details progress and upgrade HEP    Person(s) Educated Patient    Methods Explanation;Demonstration;Tactile cues;Verbal cues;Handout    Comprehension Returned demonstration;Verbalized understanding;Verbal cues required            OT Short Term Goals - 10/20/19 0903      OT SHORT TERM GOAL #1   Title Pt to be independent in HEP to increase AROM and strength in R wrist and hand to be able to  use R hand 75% of ADL's and IADL's    Baseline no knowledge    Time 3    Period Weeks    Status New    Target Date 11/10/19      OT SHORT TERM GOAL #2   Title Incision to heal for pt to be independent in scar managements to decrease scar adhesions    Baseline volar wrist scar -red and small scabs - pt to use neosporin and put on glove with washing dishes    Time 3    Period Weeks    Status New    Target Date 11/10/19             OT Long Term Goals - 10/20/19 0904      OT LONG TERM GOAL #1   Title Pt L wrist AROM increase to [facility] to be able to pushup up and fasten bra    Baseline 45 and 47 degrees for wrist ext, flexion RD, UD 22 and 18 degrees - 3 1/2wks s/p    Time 6    Period Weeks    Status New    Target Date 12/01/19      OT LONG TERM GOAL #2   Title L grip and prehension strength increase to more than 50% compare to R to pick up and carry more than 6 lbs withtout symptoms    Baseline 3 1/2 wks s/p    Time 8    Period Weeks    Status New    Target Date 12/15/19                 Plan - 11/21/19 0850    Clinical Impression Statement PT is 7 1/2 wks s/p ORIF L distal radius fx with CTR - increase AROM - still need to focus on wrist flexion , ext- showed increase grip and prehension strength -was able to upgrade to 2 lbs , and increase putty resistance    OT Occupational Profile and History Problem Focused Assessment - Including review of records relating to presenting problem    Occupational performance deficits (Please refer to evaluation for details): ADL's;IADL's;Leisure;Work;Play;Social Participation  Body Structure / Function / Physical Skills ADL;Flexibility;ROM;UE functional use;Decreased knowledge of precautions;Scar mobility;Edema;Strength;Pain;IADL    Rehab Potential Good    Clinical Decision Making Limited treatment options, no task modification necessary    Comorbidities Affecting Occupational Performance: None    Modification or Assistance to  Complete Evaluation  No modification of tasks or assist necessary to complete eval    OT Frequency 2x / week    OT Duration 6 weeks    OT Treatment/Interventions Self-care/ADL training;Therapeutic exercise;Patient/family education;Splinting;Fluidtherapy;Contrast Bath;Manual Therapy;Scar mobilization;Passive range of motion    Plan assess progress with HEP    OT Home Exercise Plan pt instruction to be seen    Consulted and Agree with Plan of Care Patient           Patient will benefit from skilled therapeutic intervention in order to improve the following deficits and impairments:   Body Structure / Function / Physical Skills: ADL, Flexibility, ROM, UE functional use, Decreased knowledge of precautions, Scar mobility, Edema, Strength, Pain, IADL       Visit Diagnosis: Stiffness of left wrist, not elsewhere classified  Scar condition and fibrosis of skin  Muscle weakness (generalized)    Problem List There are no problems to display for this patient.   Oletta Cohn OTR/l,CLT 11/21/2019, 9:19 AM  [place] [facility] PHYSICAL AND SPORTS MEDICINE [address], [place], [address] Phone: [phone]   Fax:  [phone]  Name: Delainy Mcelhiney MRN: [id number] Date of Birth: [birth year]

## 2019-11-25 ENCOUNTER — Ambulatory Visit: Payer: BC Managed Care – PPO | Admitting: Occupational Therapy

## 2019-11-25 ENCOUNTER — Other Ambulatory Visit: Payer: Self-pay

## 2019-11-25 DIAGNOSIS — M25632 Stiffness of left wrist, not elsewhere classified: Secondary | ICD-10-CM | POA: Diagnosis not present

## 2019-11-25 DIAGNOSIS — M6281 Muscle weakness (generalized): Secondary | ICD-10-CM

## 2019-11-25 DIAGNOSIS — L905 Scar conditions and fibrosis of skin: Secondary | ICD-10-CM

## 2019-11-25 NOTE — Therapy (Signed)
[place] [facility]TS [facility]ICINE [address], [place], [address] Phone: [phone]   Fax:  [phone]  Occupational Therapy Treatment  Patient Details  Name: Tara Foster MRN: [id number] Date of Birth: [birth year] Referring Provider (OT): Marney Doctor   Encounter Date: 11/25/2019   OT End of Session - 11/25/19 0745    Visit Number 8    Number of Visits 12    Date for OT Re-Evaluation 12/15/19    OT Start Time 0733    OT Stop Time 0806    OT Time Calculation (min) 33 min    Activity Tolerance Patient tolerated treatment well    Behavior During Therapy [facility] for tasks assessed/performed           No past medical history on file.  Past Surgical History:  Procedure Laterality Date  . ABDOMINAL HYSTERECTOMY    . APPENDECTOMY    . CARPAL TUNNEL RELEASE Left 09/25/2019   Procedure: CARPAL TUNNEL RELEASE;  Surgeon: Christena Flake, MD;  Location: [facility] ORS;  Service: Orthopedics;  Laterality: Left;  . ORIF WRIST FRACTURE Left 09/25/2019   Procedure: OPEN REDUCTION INTERNAL FIXATION (ORIF) WRIST FRACTURE;  Surgeon: Christena Flake, MD;  Location: [facility] ORS;  Service: Orthopedics;  Laterality: Left;    There were no vitals filed for this visit.   Subjective Assessment - 11/25/19 0737    Subjective  I am more sore - is that normal - using hand more - I think I maybe over done the putty - can open my chapstick - and using my remote with L hand    Pertinent History Pt fell on 7/17 , had surgery on 09/25/19 -and stitches come out 10/09/2019 - I had been using my hand cooking , doing my hair , washing dishes and watering my plants    Patient Stated Goals Want the motion and strength back in my L hand and wrist so I can do my yardwork , go back to work - I have to be able to pick up 70lbs , cooking , pulling and carrying objects    Currently in Pain? Yes    Pain Score 3     Pain Location Hand    Pain Orientation Left    Pain Descriptors /  Indicators Sore    Pain Type Surgical pain    Pain Onset 1 to 4 weeks ago    Pain Frequency Intermittent              [facility] OT Assessment - 11/25/19 0001      AROM   Left Wrist Extension 65 Degrees    Left Wrist Flexion 60 Degrees      Strength   Left Hand Lateral Pinch 10 lbs    Left Hand 3 Point Pinch 9 lbs                    OT Treatments/Exercises (OP) - 11/25/19 0001      LUE Paraffin   Number Minutes Paraffin 8 Minutes    LUE Paraffin Location Hand;Wrist    Comments Wrist flexion stretch- 2 x with rest inbetween              soft tissue mobs done to dorsal forearm with wrist flexion stretch  Pt to cont with scar massage   Cont with cica scar pad night timeAfter 2 days- or under splint   and encourage to use heatprior to ROM- and focus on prolonged wrist  flexion stretch  2 min at time    AROM tendon glides - individual tendon glides provided for pt to do at home  Opposition to base of 5th 10 reps AAROM for wrist extention prayer stretch( needed min A)- and wrist flexion over armrest- OT done 2 x 1 min  Done 2  lbs for wrist in all planes -12 reps pain free - 2 sets   can cont with 2 sets 2 x day - for 3 days - if not pain can increase to 3rd set HOLD off on med teal putty for grip , lat and 3 point grip For 24 to 48 hrs -and if no soreness - can start back 1 set of 10 each         OT Education - 11/25/19 0745    Education Details progress and upgrade HEP    Person(s) Educated Patient    Methods Explanation;Demonstration;Tactile cues;Verbal cues;Handout    Comprehension Returned demonstration;Verbalized understanding;Verbal cues required            OT Short Term Goals - 10/20/19 0903      OT SHORT TERM GOAL #1   Title Pt to be independent in HEP to increase AROM and strength in R wrist and hand to be able to use R hand 75% of ADL's and IADL's    Baseline no knowledge    Time 3    Period Weeks    Status New     Target Date 11/10/19      OT SHORT TERM GOAL #2   Title Incision to heal for pt to be independent in scar managements to decrease scar adhesions    Baseline volar wrist scar -red and small scabs - pt to use neosporin and put on glove with washing dishes    Time 3    Period Weeks    Status New    Target Date 11/10/19             OT Long Term Goals - 10/20/19 0904      OT LONG TERM GOAL #1   Title Pt L wrist AROM increase to [facility] to be able to pushup up and fasten bra    Baseline 45 and 47 degrees for wrist ext, flexion RD, UD 22 and 18 degrees - 3 1/2wks s/p    Time 6    Period Weeks    Status New    Target Date 12/01/19      OT LONG TERM GOAL #2   Title L grip and prehension strength increase to more than 50% compare to R to pick up and carry more than 6 lbs withtout symptoms    Baseline 3 1/2 wks s/p    Time 8    Period Weeks    Status New    Target Date 12/15/19                 Plan - 11/25/19 0745    Clinical Impression Statement Pt is 8 wks s/p ORIF L distal radius fx with CTR - pt to focus on wrist flexion stretch -and to cont with 2 lbs 2 sets in all planes - but hold off on putty- had some soreness - can start back in 24 hrs if soreness better    OT Occupational Profile and History Problem Focused Assessment - Including review of records relating to presenting problem    Occupational performance deficits (Please refer to evaluation for details): ADL's;IADL's;Leisure;Work;Play;Social Participation    Body Structure /  Function / Physical Skills ADL;Flexibility;ROM;UE functional use;Decreased knowledge of precautions;Scar mobility;Edema;Strength;Pain;IADL    Rehab Potential Good    Clinical Decision Making Limited treatment options, no task modification necessary    Comorbidities Affecting Occupational Performance: None    Modification or Assistance to Complete Evaluation  No modification of tasks or assist necessary to complete eval    OT Frequency 2x / week     OT Duration 6 weeks    OT Treatment/Interventions Self-care/ADL training;Therapeutic exercise;Patient/family education;Splinting;Fluidtherapy;Contrast Bath;Manual Therapy;Scar mobilization;Passive range of motion    Plan assess progress with HEP    OT Home Exercise Plan pt instruction to be seen    Consulted and Agree with Plan of Care Patient           Patient will benefit from skilled therapeutic intervention in order to improve the following deficits and impairments:   Body Structure / Function / Physical Skills: ADL, Flexibility, ROM, UE functional use, Decreased knowledge of precautions, Scar mobility, Edema, Strength, Pain, IADL       Visit Diagnosis: Stiffness of left wrist, not elsewhere classified  Scar condition and fibrosis of skin  Muscle weakness (generalized)    Problem List There are no problems to display for this patient.   Oletta Cohn OTR/L,CLT 11/25/2019, 8:11 AM  [place] [facility] PHYSICAL AND SPORTS MEDICINE [address] [place], [address] Phone: [phone]   Fax:  [phone]  Name: Tara Foster MRN: [id number] Date of Birth: 07-06-1960

## 2019-12-01 ENCOUNTER — Other Ambulatory Visit: Payer: Self-pay

## 2019-12-01 ENCOUNTER — Ambulatory Visit: Payer: BC Managed Care – PPO | Admitting: Occupational Therapy

## 2019-12-01 DIAGNOSIS — M25632 Stiffness of left wrist, not elsewhere classified: Secondary | ICD-10-CM | POA: Diagnosis not present

## 2019-12-01 DIAGNOSIS — M6281 Muscle weakness (generalized): Secondary | ICD-10-CM

## 2019-12-01 DIAGNOSIS — L905 Scar conditions and fibrosis of skin: Secondary | ICD-10-CM

## 2019-12-01 NOTE — Therapy (Signed)
[place] [facility] PH[facility]ORTS MEDICINE [address] [place], [address] Phone: [phone]   Fax:  [phone]  Occupational Therapy Treatment  Patient Details  Name: Tara Foster MRN: [id number] Date of Birth: 1960/12/19 Referring Provider (OT): Marney Doctor   Encounter Date: 12/01/2019   OT End of Session - 12/01/19 0749    Visit Number 9    Number of Visits 12    Date for OT Re-Evaluation 12/15/19    OT Start Time 0733    OT Stop Time 0802    OT Time Calculation (min) 29 min    Activity Tolerance Patient tolerated treatment well    Behavior During Therapy [facility] for tasks assessed/performed           No past medical history on file.  Past Surgical History:  Procedure Laterality Date   ABDOMINAL HYSTERECTOMY     APPENDECTOMY     CARPAL TUNNEL RELEASE Left 09/25/2019   Procedure: CARPAL TUNNEL RELEASE;  Surgeon: Christena Flake, MD;  Location: [facility] ORS;  Service: Orthopedics;  Laterality: Left;   ORIF WRIST FRACTURE Left 09/25/2019   Procedure: OPEN REDUCTION INTERNAL FIXATION (ORIF) WRIST FRACTURE;  Surgeon: Christena Flake, MD;  Location: [facility] ORS;  Service: Orthopedics;  Laterality: Left;    There were no vitals filed for this visit.   Subjective Assessment - 12/01/19 0746    Subjective  I can tell that I am using it more - things easier - scar I thinks looks better- not wearing splint at home - soreness better- started back the putty yesterday and doing 2 sets with the 2 lbs weight    Pertinent History Pt fell on 7/17 , had surgery on 09/25/19 -and stitches come out 10/09/2019 - I had been using my hand cooking , doing my hair , washing dishes and watering my plants    Patient Stated Goals Want the motion and strength back in my L hand and wrist so I can do my yardwork , go back to work - I have to be able to pick up 70lbs , cooking , pulling and carrying objects    Currently in Pain? No/denies              [facility] OT  Assessment - 12/01/19 0001      AROM   Left Wrist Extension 70 Degrees    Left Wrist Flexion 65 Degrees    Left Wrist Radial Deviation 25 Degrees    Left Wrist Ulnar Deviation 28 Degrees      Strength   Right Hand Grip (lbs) 50    Right Hand Lateral Pinch 13 lbs    Right Hand 3 Point Pinch 11 lbs    Left Hand Grip (lbs) 32    Left Hand Lateral Pinch 12 lbs    Left Hand 3 Point Pinch 9 lbs           Pt showed increase wrist flexion , extention as well as grip and prehension strength- see flowsheet          OT Treatments/Exercises (OP) - 12/01/19 0001      LUE Paraffin   Number Minutes Paraffin 8 Minutes    LUE Paraffin Location Hand;Wrist    Comments wrist flexion stretch 3 x 2 min             scar massage done by OT and xtractor on volar wrist crease and CT scar - with brushing using Graston tool nr 2 - prior  to [facility]  Cont with cica scar pad night time  and encourage to use heatprior to ROM- and focus on prolonged wrist flexion stretch( 2 min at time)  and add this date table slides 20 reps- can do 2 x day - pain free     AAROM for wrist extention prayer stretch- and wrist flexion over armrest- OT done 2 x 1 min  Cont 2lbs for wrist in all planes -12 reps pain free -2 sets   can cont with 2 sets 2 x day - and increase if pain free to 3rd set tomorrow  putty teal cont 1  Set of 12 2 x day -pain free  And then increase in 3 days to 2nd set and 6 days to 3rd set if pain free  Pt was able to carry 5 lbs - without symptoms -could feel the 6 lbs          OT Education - 12/01/19 0749    Education Details progress and upgrade HEP    Person(s) Educated Patient    Methods Explanation;Demonstration;Tactile cues;Verbal cues;Handout    Comprehension Returned demonstration;Verbalized understanding;Verbal cues required            OT Short Term Goals - 10/20/19 0903      OT SHORT TERM GOAL #1   Title Pt to be independent in HEP to increase AROM  and strength in R wrist and hand to be able to use R hand 75% of ADL's and IADL's    Baseline no knowledge    Time 3    Period Weeks    Status New    Target Date 11/10/19      OT SHORT TERM GOAL #2   Title Incision to heal for pt to be independent in scar managements to decrease scar adhesions    Baseline volar wrist scar -red and small scabs - pt to use neosporin and put on glove with washing dishes    Time 3    Period Weeks    Status New    Target Date 11/10/19             OT Long Term Goals - 10/20/19 0904      OT LONG TERM GOAL #1   Title Pt L wrist AROM increase to [facility] to be able to pushup up and fasten bra    Baseline 45 and 47 degrees for wrist ext, flexion RD, UD 22 and 18 degrees - 3 1/2wks s/p    Time 6    Period Weeks    Status New    Target Date 12/01/19      OT LONG TERM GOAL #2   Title L grip and prehension strength increase to more than 50% compare to R to pick up and carry more than 6 lbs withtout symptoms    Baseline 3 1/2 wks s/p    Time 8    Period Weeks    Status New    Target Date 12/15/19                 Plan - 12/01/19 0749    Clinical Impression Statement Pt is 8 1/2  wks s/p ORIF L distal radius fx with CTR - pt to cont to focus on wrist flexion , ext - added this date wrist extention table slides- tolerated well  -to cont with 2 lbs 2 sets in all planes and increase in day or 2 to 3 sets - keep putty same for 2-3 days at  medium teal - cont scar massage - able to carry  about 5 lbs without symptoms    OT Occupational Profile and History Problem Focused Assessment - Including review of records relating to presenting problem    Occupational performance deficits (Please refer to evaluation for details): ADL's;IADL's;Leisure;Work;Play;Social Participation    Body Structure / Function / Physical Skills ADL;Flexibility;ROM;UE functional use;Decreased knowledge of precautions;Scar mobility;Edema;Strength;Pain;IADL    Rehab Potential Good     Clinical Decision Making Limited treatment options, no task modification necessary    Comorbidities Affecting Occupational Performance: None    Modification or Assistance to Complete Evaluation  No modification of tasks or assist necessary to complete eval    OT Frequency 2x / week    OT Duration 6 weeks    OT Treatment/Interventions Self-care/ADL training;Therapeutic exercise;Patient/family education;Splinting;Fluidtherapy;Contrast Bath;Manual Therapy;Scar mobilization;Passive range of motion    Plan assess progress with HEP    OT Home Exercise Plan pt instruction to be seen           Patient will benefit from skilled therapeutic intervention in order to improve the following deficits and impairments:   Body Structure / Function / Physical Skills: ADL, Flexibility, ROM, UE functional use, Decreased knowledge of precautions, Scar mobility, Edema, Strength, Pain, IADL       Visit Diagnosis: Stiffness of left wrist, not elsewhere classified  Scar condition and fibrosis of skin  Muscle weakness (generalized)    Problem List There are no problems to display for this patient.   Oletta Cohn OTR/L,CLT 12/01/2019, 8:08 AM  [place] [facility] PHYSICAL AND SPORTS MEDICINE [address] [place], [address] Phone: [phone]   Fax:  [phone]  Name: Tara Foster MRN: [id number] Date of Birth: 10-08-1960

## 2019-12-08 ENCOUNTER — Encounter: Payer: BC Managed Care – PPO | Admitting: Occupational Therapy

## 2019-12-16 ENCOUNTER — Ambulatory Visit: Payer: BC Managed Care – PPO | Attending: Student | Admitting: Occupational Therapy

## 2019-12-16 ENCOUNTER — Other Ambulatory Visit: Payer: Self-pay

## 2019-12-16 DIAGNOSIS — M6281 Muscle weakness (generalized): Secondary | ICD-10-CM | POA: Diagnosis present

## 2019-12-16 DIAGNOSIS — L905 Scar conditions and fibrosis of skin: Secondary | ICD-10-CM | POA: Diagnosis present

## 2019-12-16 DIAGNOSIS — M25632 Stiffness of left wrist, not elsewhere classified: Secondary | ICD-10-CM | POA: Diagnosis not present

## 2019-12-16 NOTE — Therapy (Signed)
[place] [facility] PH[facility]TS MEDICINE [address] [place], [address] Phone: [phone]   Fax:  [phone]  Occupational Therapy Treatment  Patient Details  Name: Tara Foster MRN: [id number] Date of Birth: 1960-05-14 Referring Provider (OT): Marney Doctor   Encounter Date: 12/16/2019   OT End of Session - 12/16/19 0817    Visit Number 10    Number of Visits 12    Date for OT Re-Evaluation 12/15/19    OT Start Time 0733    OT Stop Time 0811    OT Time Calculation (min) 38 min    Activity Tolerance Patient tolerated treatment well    Behavior During Therapy [facility] for tasks assessed/performed           No past medical history on file.  Past Surgical History:  Procedure Laterality Date  . ABDOMINAL HYSTERECTOMY    . APPENDECTOMY    . CARPAL TUNNEL RELEASE Left 09/25/2019   Procedure: CARPAL TUNNEL RELEASE;  Surgeon: Christena Flake, MD;  Location: [facility] ORS;  Service: Orthopedics;  Laterality: Left;  . ORIF WRIST FRACTURE Left 09/25/2019   Procedure: OPEN REDUCTION INTERNAL FIXATION (ORIF) WRIST FRACTURE;  Surgeon: Christena Flake, MD;  Location: [facility] ORS;  Service: Orthopedics;  Laterality: Left;    There were no vitals filed for this visit.   Subjective Assessment - 12/16/19 0816    Subjective  I am doing my exercises - but should it be sore the whole time - I am always doing something to my wrist , bending it or the scar    Pertinent History Pt fell on 7/17 , had surgery on 09/25/19 -and stitches come out 10/09/2019 - I had been using my hand cooking , doing my hair , washing dishes and watering my plants    Patient Stated Goals Want the motion and strength back in my L hand and wrist so I can do my yardwork , go back to work - I have to be able to pick up 70lbs , cooking , pulling and carrying objects    Currently in Pain? Yes    Pain Score 2     Pain Location Wrist    Pain Orientation Left    Pain Descriptors / Indicators Sore     Pain Type Surgical pain    Pain Onset 1 to 4 weeks ago    Pain Frequency Constant              [facility] OT Assessment - 12/16/19 0001      AROM   Left Wrist Extension 70 Degrees    Left Wrist Flexion 73 Degrees      Strength   Right Hand Grip (lbs) 50    Right Hand Lateral Pinch 13 lbs    Right Hand 3 Point Pinch 11 lbs    Left Hand Grip (lbs) 35    Left Hand Lateral Pinch 11 lbs    Left Hand 3 Point Pinch 10 lbs            Pt made progress in wrist ext, flexion - able to weight bear and push up from chair - and wall pushup  Add to HEP after she does prayer stretch and table slides -20 reps pain free Can do 12 reps  2 x day Kept putty same  Pt report she does HEP 3 x day but only 1 set  Review with pt and explain again reps, sets and x of day Pt report soreness  in wrist constant and more than 2/10        OT Treatments/Exercises (OP) - 12/16/19 0001      LUE Paraffin   Number Minutes Paraffin 8 Minutes    LUE Paraffin Location Hand;Wrist    Comments wrist flexion stretch 3 x 2 min             scar massage done by OT and xtractor on volar wrist crease and CT scar - with brushing using Graston tool nr 2 - prior to [employer] with cica scar pad night time  Review pt HEP and adjust to decrease soreness    use heatprior to PROM- and focus on prolonged wrist flexion stretch( 1 min at time) x 3 a   AAROM for wrist extention prayer stretch- and wrist flexion over armrest- OT done 2 x 1 min Change pt HEP to 1 lbs for wrist ext and flexion 3 x 12 reps - 2 x day  And then Cont 2lbs for wrist sup, pron, RD, UD 2x 12 reps - 2 x day  Pain free Can increase 2 lbs to 3 sets in 3 days if pain free - keep wrist flexion ,ext at 1 lbs 3 sets 2 x day      putty teal putty for grip, lat and 3 point grip 2 x 12 reps - 2 x day  And then increase in 3 days to 3rd set if pain free  USe light blue neoprene  Wrap with heavy activities to decrease soreness           OT Education - 12/16/19 0817    Education Details progress and changes to HEP    Person(s) Educated Patient    Methods Explanation;Demonstration;Tactile cues;Verbal cues;Handout    Comprehension Returned demonstration;Verbalized understanding;Verbal cues required            OT Short Term Goals - 10/20/19 0903      OT SHORT TERM GOAL #1   Title Pt to be independent in HEP to increase AROM and strength in R wrist and hand to be able to use R hand 75% of ADL's and IADL's    Baseline no knowledge    Time 3    Period Weeks    Status New    Target Date 11/10/19      OT SHORT TERM GOAL #2   Title Incision to heal for pt to be independent in scar managements to decrease scar adhesions    Baseline volar wrist scar -red and small scabs - pt to use neosporin and put on glove with washing dishes    Time 3    Period Weeks    Status New    Target Date 11/10/19             OT Long Term Goals - 10/20/19 0904      OT LONG TERM GOAL #1   Title Pt L wrist AROM increase to [facility] to be able to pushup up and fasten bra    Baseline 45 and 47 degrees for wrist ext, flexion RD, UD 22 and 18 degrees - 3 1/2wks s/p    Time 6    Period Weeks    Status New    Target Date 12/01/19      OT LONG TERM GOAL #2   Title L grip and prehension strength increase to more than 50% compare to R to pick up and carry more than 6 lbs withtout symptoms    Baseline 3  1/2 wks s/p    Time 8    Period Weeks    Status New    Target Date 12/15/19                 Plan - 12/16/19 0817    Clinical Impression Statement Pt is 11 1/2 wks s/p ORIF L distal radius fx with CTR - pt cont to show great progress in wrist extentoin more than flexion , able to push up from chair and wall pushup -but decrease flexion still with some scar tissue - grip increased - prehension strength WFL - pt do show increase soreness because of not doing corrext amount of sets a day - decrease weight to 1 lbs for wrist  flexion , ext and  can cont 2lbs for others motions- and 2 sets - to increase and review again reps, sets and times a day - not to over do stretches and scar mobs during day - cont increase funcional use    OT Occupational Profile and History Problem Focused Assessment - Including review of records relating to presenting problem    Occupational performance deficits (Please refer to evaluation for details): ADL's;IADL's;Leisure;Work;Play;Social Participation    Body Structure / Function / Physical Skills ADL;Flexibility;ROM;UE functional use;Decreased knowledge of precautions;Scar mobility;Edema;Strength;Pain;IADL    Rehab Potential Good    Clinical Decision Making Limited treatment options, no task modification necessary    Comorbidities Affecting Occupational Performance: None    Modification or Assistance to Complete Evaluation  No modification of tasks or assist necessary to complete eval    OT Frequency 1x / week    OT Duration 2 weeks    OT Treatment/Interventions Self-care/ADL training;Therapeutic exercise;Patient/family education;Splinting;Fluidtherapy;Contrast Bath;Manual Therapy;Scar mobilization;Passive range of motion    Plan assess progress with HEP    OT Home Exercise Plan pt instruction to be seen    Consulted and Agree with Plan of Care Patient           Patient will benefit from skilled therapeutic intervention in order to improve the following deficits and impairments:   Body Structure / Function / Physical Skills: ADL, Flexibility, ROM, UE functional use, Decreased knowledge of precautions, Scar mobility, Edema, Strength, Pain, IADL       Visit Diagnosis: Stiffness of left wrist, not elsewhere classified  Scar condition and fibrosis of skin  Muscle weakness (generalized)    Problem List There are no problems to display for this patient.   Oletta Cohn OTR/L,CLT 12/16/2019, 9:42 AM  [place] [facility] PHYSICAL AND SPORTS  MEDICINE [address] [place], [address] Phone: [phone]   Fax:  [phone]  Name: Tara Foster MRN: [id number] Date of Birth: 1960/10/07

## 2019-12-23 ENCOUNTER — Other Ambulatory Visit: Payer: Self-pay

## 2019-12-23 ENCOUNTER — Ambulatory Visit: Payer: BC Managed Care – PPO | Admitting: Occupational Therapy

## 2019-12-23 DIAGNOSIS — L905 Scar conditions and fibrosis of skin: Secondary | ICD-10-CM

## 2019-12-23 DIAGNOSIS — M25632 Stiffness of left wrist, not elsewhere classified: Secondary | ICD-10-CM | POA: Diagnosis not present

## 2019-12-23 DIAGNOSIS — M6281 Muscle weakness (generalized): Secondary | ICD-10-CM

## 2019-12-23 NOTE — Therapy (Signed)
[place] [facility] PH[facility]TS MEDICINE [address] [place], [address] Phone: [phone]   Fax:  [phone]  Occupational Therapy Treatment  Patient Details  Name: Tara Foster MRN: [id number] Date of Birth: 1960/12/28 Referring Provider (OT): Marney Doctor   Encounter Date: 12/23/2019   OT End of Session - 12/23/19 0946    Visit Number 11    Number of Visits 14    Date for OT Re-Evaluation 01/13/20    OT Start Time 0731    OT Stop Time 0808    OT Time Calculation (min) 37 min    Activity Tolerance Patient tolerated treatment well    Behavior During Therapy [facility] for tasks assessed/performed           No past medical history on file.  Past Surgical History:  Procedure Laterality Date  . ABDOMINAL HYSTERECTOMY    . APPENDECTOMY    . CARPAL TUNNEL RELEASE Left 09/25/2019   Procedure: CARPAL TUNNEL RELEASE;  Surgeon: Christena Flake, MD;  Location: [facility] ORS;  Service: Orthopedics;  Laterality: Left;  . ORIF WRIST FRACTURE Left 09/25/2019   Procedure: OPEN REDUCTION INTERNAL FIXATION (ORIF) WRIST FRACTURE;  Surgeon: Christena Flake, MD;  Location: [facility] ORS;  Service: Orthopedics;  Laterality: Left;    There were no vitals filed for this visit.   Subjective Assessment - 12/23/19 0945    Subjective  My wrist is still sore - some in the morning but then worse in the afternoon - not more than 3/10 - I am trying to use it - just not in anything heavy    Pertinent History Pt fell on 7/17 , had surgery on 09/25/19 -and stitches come out 10/09/2019 - I had been using my hand cooking , doing my hair , washing dishes and watering my plants    Patient Stated Goals Want the motion and strength back in my L hand and wrist so I can do my yardwork , go back to work - I have to be able to pick up 70lbs , cooking , pulling and carrying objects    Currently in Pain? Yes    Pain Score 2     Pain Location Wrist    Pain Orientation Left    Pain Descriptors  / Indicators Sore    Pain Type Surgical pain    Pain Onset More than a month ago    Pain Frequency Constant              [facility] OT Assessment - 12/23/19 0001      AROM   Right Wrist Extension 80 Degrees    Right Wrist Flexion 90 Degrees    Left Wrist Extension 73 Degrees    Left Wrist Flexion 73 Degrees      Strength   Right Hand Grip (lbs) 50    Right Hand Lateral Pinch 13 lbs    Right Hand 3 Point Pinch 11 lbs    Left Hand Grip (lbs) 37    Left Hand Lateral Pinch 11 lbs    Left Hand 3 Point Pinch 10 lbs           pt show increase AROM in all planes - working on weight bearing and push ups for wrist extention - pain free Flexion with resistance - some pain over FCU  And tender if palpating Pt to work on PROM only for wrist flexion- do not force flexion with weight or during motion Use heat prior  Strength  in wrist in all planes 4+/5  Stop weight for wrist  And hold off on putty for about 5 days - only do PROM , stretches for wrist flexion , extention          OT Treatments/Exercises (OP) - 12/23/19 0001      LUE Paraffin   Number Minutes Paraffin 8 Minutes    LUE Paraffin Location Hand;Wrist    Comments prior to scar massage and review of HEP            scar massage done by OT and xtractor on volar wrist crease and CT scar - with brushing using Graston tool nr 2  Cont with cica scar pad night time     use heatprior to PROM-make sure she use R hand to perform PROM or stretch for wrist flexion stretch( 1-2 min at time) x 3  Can do still table slides for wrist extention - prior to wall push ups  20 x slides and then wall pushups - 2 x12 reps   Hold off on putty teal putty for grip, lat and 3 point grip 2 x 12 reps for 5 days if pain free -then can start back  Use her light blue neoprene  Wrap with activities for 3-5 days to decrease soreness in FCU  Use hand in all tasks less than 8 lbs- was able to lift and carry 7 lbs without increase  symptoms            OT Short Term Goals - 12/23/19 1038      OT SHORT TERM GOAL #1   Title Pt to be independent in HEP to increase AROM and strength in R wrist and hand to be able to use R hand 75% of ADL's and IADL's    Status Achieved      OT SHORT TERM GOAL #2   Title Incision to heal for pt to be independent in scar managements to decrease scar adhesions    Status Achieved             OT Long Term Goals - 12/23/19 1039      OT LONG TERM GOAL #1   Title Pt L wrist AROM increase to [facility] to be able to pushup up and fasten bra    Baseline see flowsheet    Status Achieved      OT LONG TERM GOAL #2   Title L grip and prehension strength increase to more than 50% compare to R to pick up and carry more than 6 lbs withtout symptoms    Status Achieved      OT LONG TERM GOAL #3   Title Pt able to perform all ADL's and IADL's using L hand and wrist in less than 10lbs with soreness less than 2/10    Baseline pt report constant soreness by the afternoon - in volar ulnar wrist -appear to have some irritation of FCU - did wash laundry by hand the other day and force flexion stretch    Time 3    Period Weeks    Status New    Target Date 01/13/20                 Plan - 12/23/19 0948    Clinical Impression Statement Pt is about 3 months s/p ORIF L distal radius fx with CTR - pt doing very well with strength in L hand and wrist - and functional use - she still need to focus on flexion of wrist -  appear she irritated FCU - pt to wear soft wrap on wrist for few days, hold off on putty and stop weight - just use her hand functionally - and will follow up in week    OT Occupational Profile and History Problem Focused Assessment - Including review of records relating to presenting problem    Occupational performance deficits (Please refer to evaluation for details): ADL's;IADL's;Leisure;Work;Play;Social Participation    Body Structure / Function / Physical Skills  ADL;Flexibility;ROM;UE functional use;Decreased knowledge of precautions;Scar mobility;Edema;Strength;Pain;IADL    Rehab Potential Good    Clinical Decision Making Limited treatment options, no task modification necessary    Comorbidities Affecting Occupational Performance: None    Modification or Assistance to Complete Evaluation  No modification of tasks or assist necessary to complete eval    OT Frequency 1x / week    OT Duration 2 weeks   3 wks   OT Treatment/Interventions Self-care/ADL training;Therapeutic exercise;Patient/family education;Splinting;Fluidtherapy;Contrast Bath;Manual Therapy;Scar mobilization;Passive range of motion    Plan assess progress with HEP    OT Home Exercise Plan pt instruction to be seen    Consulted and Agree with Plan of Care Patient           Patient will benefit from skilled therapeutic intervention in order to improve the following deficits and impairments:   Body Structure / Function / Physical Skills: ADL, Flexibility, ROM, UE functional use, Decreased knowledge of precautions, Scar mobility, Edema, Strength, Pain, IADL       Visit Diagnosis: Stiffness of left wrist, not elsewhere classified  Scar condition and fibrosis of skin  Muscle weakness (generalized)    Problem List There are no problems to display for this patient.   Oletta Cohn OTR/L,CLT 12/23/2019, 10:42 AM  [place] [facility] PHYSICAL AND SPORTS MEDICINE [address], [place], [address] Phone: [phone]   Fax:  [phone]  Name: Shaeley Segall MRN: [id number] Date of Birth: 1960/06/22

## 2020-01-01 ENCOUNTER — Other Ambulatory Visit: Payer: Self-pay

## 2020-01-01 ENCOUNTER — Ambulatory Visit: Payer: BC Managed Care – PPO | Admitting: Occupational Therapy

## 2020-01-01 DIAGNOSIS — L905 Scar conditions and fibrosis of skin: Secondary | ICD-10-CM

## 2020-01-01 DIAGNOSIS — M25632 Stiffness of left wrist, not elsewhere classified: Secondary | ICD-10-CM | POA: Diagnosis not present

## 2020-01-01 DIAGNOSIS — M6281 Muscle weakness (generalized): Secondary | ICD-10-CM

## 2020-01-01 NOTE — Therapy (Signed)
[place] [facility] PH[facility]ORTS MEDICINE [address] [place], [address] Phone: [phone]   Fax:  [phone]  Occupational Therapy Treatment  Patient Details  Name: Tara Foster MRN: [id number] Date of Birth: Apr 27, 1960 Referring Provider (OT): Marney Doctor   Encounter Date: 01/01/2020   OT End of Session - 01/01/20 0902    Visit Number 12    Number of Visits 14    Date for OT Re-Evaluation 01/13/20    OT Start Time 0750    OT Stop Time 0818    OT Time Calculation (min) 28 min    Activity Tolerance Patient tolerated treatment well    Behavior During Therapy [facility] for tasks assessed/performed           No past medical history on file.  Past Surgical History:  Procedure Laterality Date   ABDOMINAL HYSTERECTOMY     APPENDECTOMY     CARPAL TUNNEL RELEASE Left 09/25/2019   Procedure: CARPAL TUNNEL RELEASE;  Surgeon: Christena Flake, MD;  Location: [facility] ORS;  Service: Orthopedics;  Laterality: Left;   ORIF WRIST FRACTURE Left 09/25/2019   Procedure: OPEN REDUCTION INTERNAL FIXATION (ORIF) WRIST FRACTURE;  Surgeon: Christena Flake, MD;  Location: [facility] ORS;  Service: Orthopedics;  Laterality: Left;    There were no vitals filed for this visit.   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 No/denies               [facility] OT Assessment - 01/01/20 0001      AROM   Left Wrist Extension 75 Degrees    Left Wrist Flexion 80 Degrees      Strength   Right Hand Grip (lbs) 50    Right Hand Lateral Pinch 15 lbs    Right Hand 3 Point Pinch 14 lbs    Left Hand Grip (lbs) 40    Left Hand Lateral Pinch 12 lbs    Left Hand 3 Point Pinch 11 lbs             pt show increase AROM  In flexion and extention of L wrist this date  - working on weight bearing and push ups for wrist extention - pain free Less pain over FCU this date - soreness decrease  Pt to cont with only PROM for end range wrist flexion- and not forcing flexion with AROM or weight  Can use heat prior to stretches  Strength in wrist in all planes 4+/5   She can cont with scar massage   use heatprior toPROM-make sure she use R hand to perform PROM or stretch for wrist flexion stretch(1-64min at time) x 3  Can do  still table slides for wrist extention - prior to wall push ups  20 x slides and then wall pushups - 2 x12 reps No pain with pushups if doing table slides  Cont with putty gripping - 24 reps - 2 x can increase to 3 sets of 12 in few days  Use her light blue neoprene Wrap with activities that is more than 8 lbs in weight - and if she can - when she returns to work in the beginning  Was able to lift and carry 8 lbs without increase symptoms Had pull and some discomfort with 9 and 10 lbs  could do 10-11 lbs bilaterally - but not 13-15 lbs - had pain  Pt to discuss option of returning to work tomorrow- maybe shorter days in the beginning and increase gradually  Pt can call me for one more follow up after returning to work -and if she needs to discuss modifications to some of her work tasks - if it cause her any difficulty or discomfort                 OT Education - 01/01/20 0902    Education Details progress and changes to HEP    Person(s) Educated Patient    Methods  Explanation;Demonstration;Tactile cues;Verbal cues;Handout    Comprehension Returned demonstration;Verbalized understanding;Verbal cues required            OT Short Term Goals - 12/23/19 1038      OT SHORT TERM GOAL #1   Title Pt to be independent in HEP to increase AROM and strength in R wrist and hand to be able to use R hand 75% of ADL's and IADL's    Status Achieved      OT SHORT TERM GOAL #2   Title Incision to heal for pt to be independent in scar managements to decrease scar adhesions    Status Achieved             OT Long Term Goals - 12/23/19 1039      OT LONG TERM GOAL #1   Title Pt L wrist AROM increase to [facility] to be able to pushup up and fasten bra    Baseline see flowsheet    Status Achieved      OT LONG TERM GOAL #2   Title L grip and prehension strength increase to more than 50% compare to R to pick up and carry more than 6 lbs withtout symptoms    Status Achieved      OT LONG TERM GOAL #3   Title Pt able to perform all ADL's and IADL's using L hand and wrist in less than 10lbs with soreness less than 2/10    Baseline pt report constant soreness by the afternoon - in volar ulnar wrist -appear to have some irritation of FCU - did wash laundry by hand the other day and force flexion stretch    Time 3    Period Weeks    Status New    Target Date 01/13/20                 Plan - 01/01/20 0903    Clinical Impression Statement Pt is just over 3 months s/p ORIF L distal radius fx with CTR - pt soreness improved greatly -and also increase flexion of wrist , grip increased - pt doing better using hand in more actiivties- she is able to pick up this date 8 lbs without pain - did feel pull at wrist  with 9 lbs- and bilateral could do 10-11 lbs - but had pain with 13-15 lbs - would recommend for her to return to work if she can 4 hrs for week or 2 and increase from there - she has neoprene wrist wrap to wear if needed    OT Occupational Profile and History Problem  Focused Assessment - Including review of records relating to presenting problem    Occupational performance deficits (Please refer to evaluation for details): ADL's;IADL's;Leisure;Work;Play;Social Participation    Body Structure / Function / Physical Skills ADL;Flexibility;ROM;UE functional use;Decreased knowledge of precautions;Scar mobility;Edema;Strength;Pain;IADL    Rehab Potential Good    Clinical Decision Making Limited treatment options, no task modification necessary    Comorbidities Affecting Occupational Performance: None    Modification or Assistance to Complete Evaluation  No modification of tasks or assist necessary to complete eval    OT Frequency 1x / week    OT Duration 2 weeks    OT Treatment/Interventions Self-care/ADL training;Therapeutic exercise;Patient/family education;Splinting;Fluidtherapy;Contrast Bath;Manual Therapy;Scar mobilization;Passive range of motion    Plan assess progress with HEP    OT Home Exercise Plan pt instruction to be seen    Consulted and Agree with Plan of Care Patient           Patient will benefit from skilled therapeutic intervention in order to improve the following deficits and impairments:   Body Structure / Function / Physical Skills: ADL, Flexibility, ROM, UE functional use, Decreased knowledge of precautions, Scar mobility, Edema, Strength, Pain, IADL       Visit Diagnosis: Stiffness of left wrist, not elsewhere classified  Scar condition and fibrosis of skin  Muscle weakness (generalized)    Problem List There are no problems to display for this patient.   Oletta Cohn OTR/L,CLT 01/01/2020, 10:02 AM  [place] [facility] PHYSICAL AND SPORTS MEDICINE [address] [place], [address] Phone: [phone]   Fax:  [phone]  Name: Tara Foster MRN: [id number] Date of Birth: 09-Nov-1960

## 2022-05-05 IMAGING — CR DG WRIST COMPLETE 3+V*L*
4 series · 4 of 4 positions shown · non-contrast
Comparison: None.

CLINICAL DATA: Swelling, bruising and pain through left wrist s/p
fall last night.

EXAM:
LEFT WRIST - COMPLETE 3+ VIEW

[wrist pa]
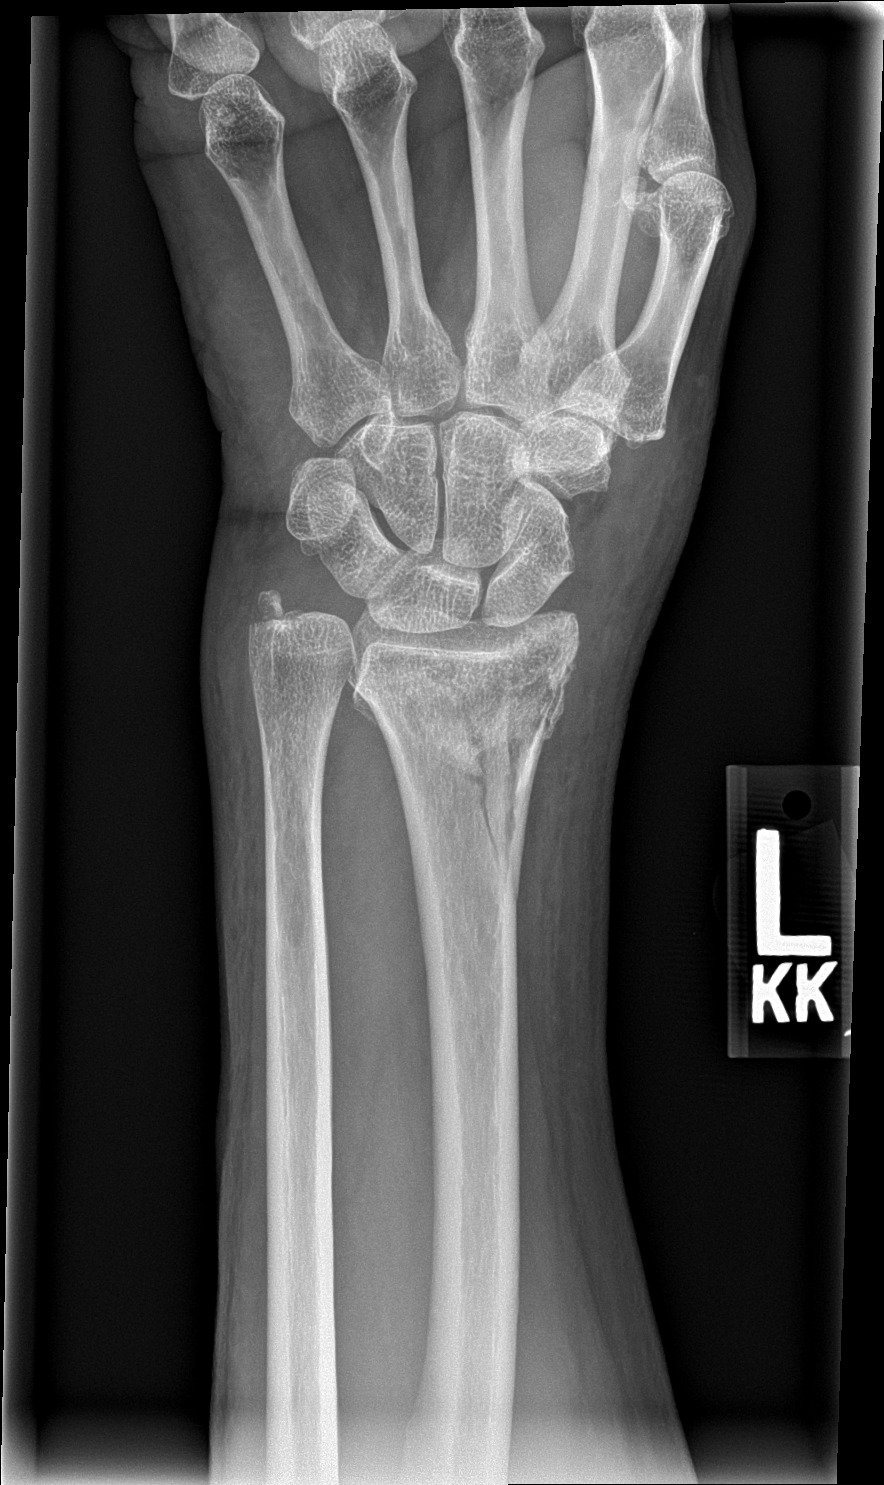

[wrist obl]
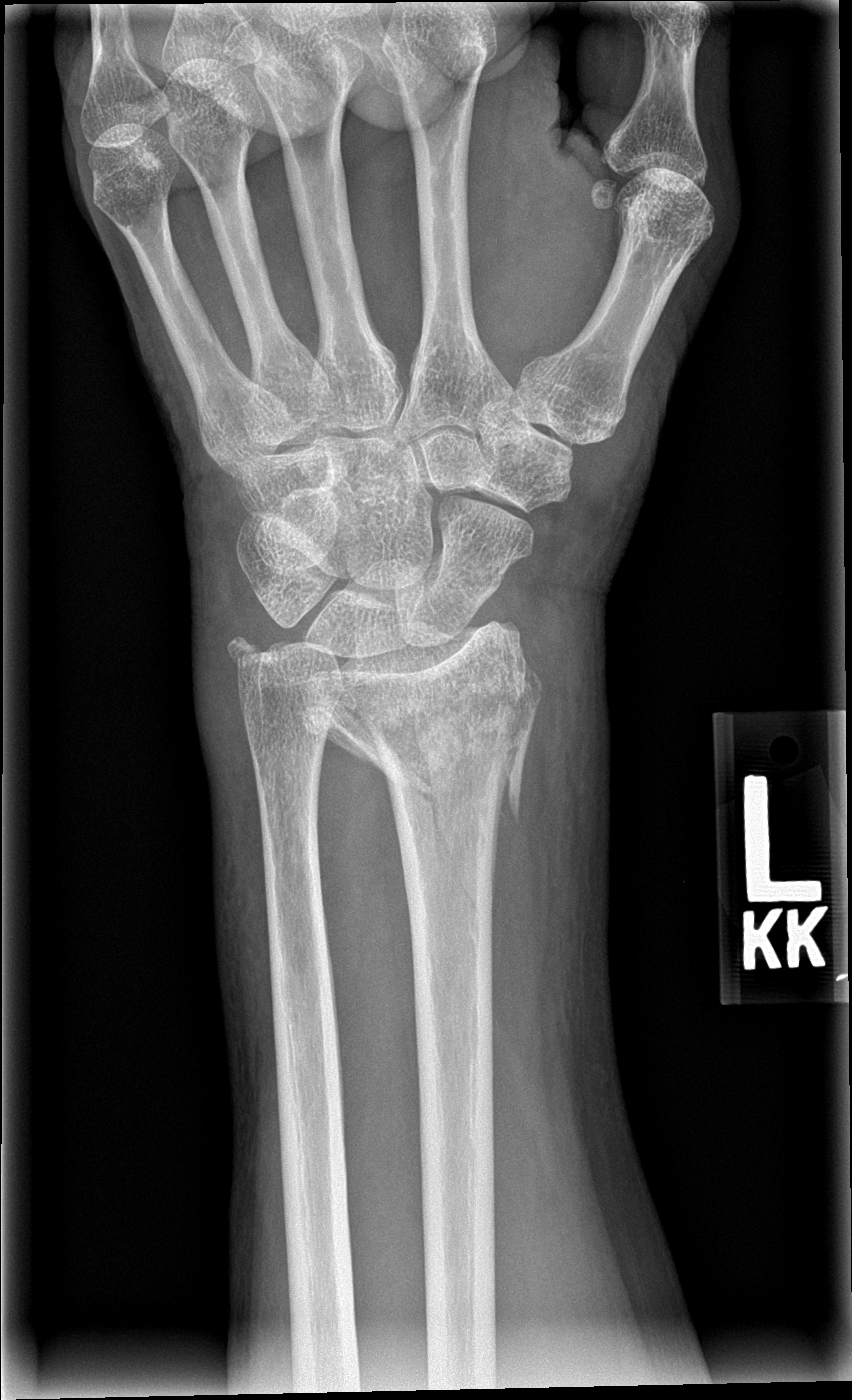

[wrist lat]
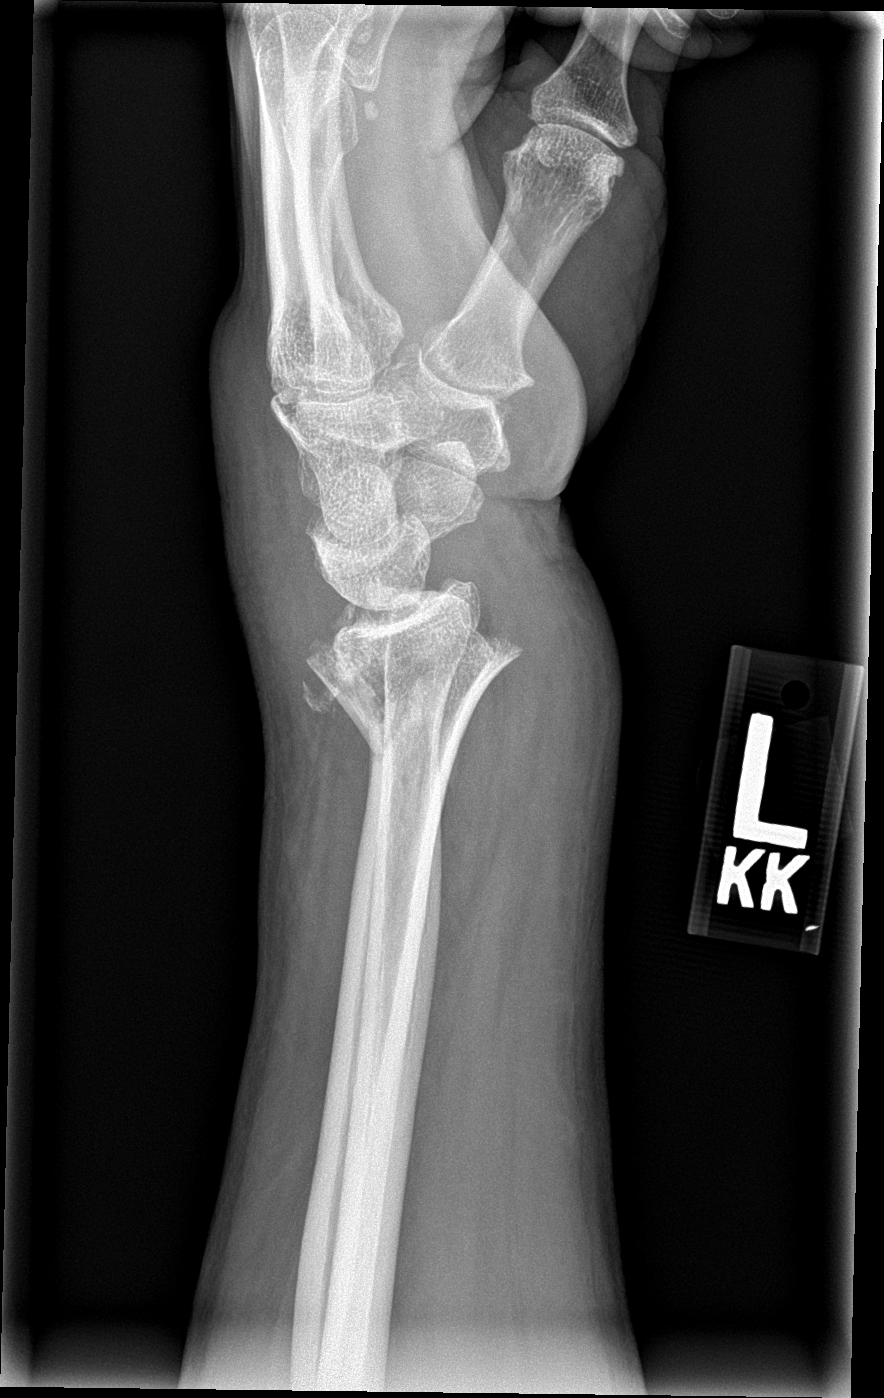

[wrist navicular]
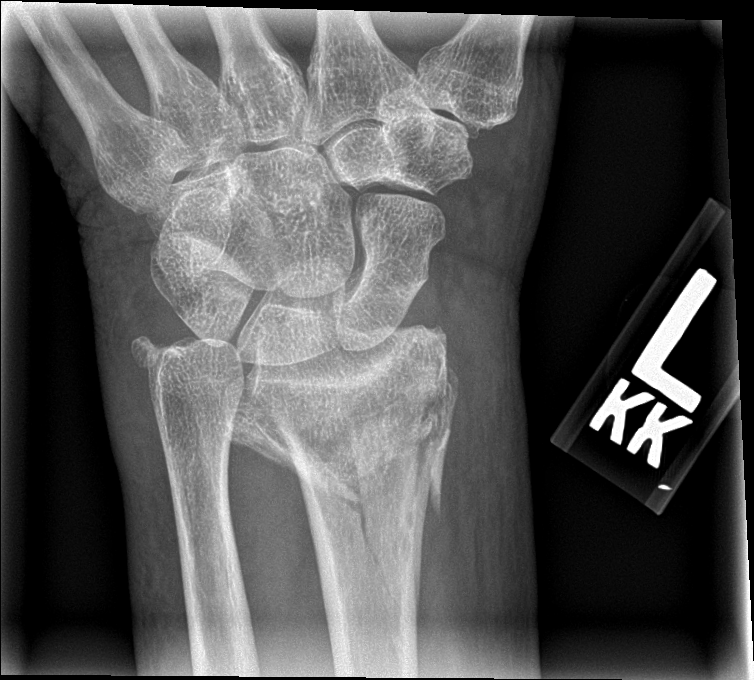

[4 of 4 positions shown; findings below may reference images not displayed]

FINDINGS: There is a comminuted, dorsally angulated and impacted fracture of
the distal left radius with intra-articular extension. There is a
displaced ulnar styloid process fracture. No evidence of
dislocation. There is regional soft tissue swelling.
IMPRESSION: 1. Comminuted and angulated fracture of the distal left radius with
intra-articular involvement.

2.  Ulnar styloid process fracture.
